# Patient Record
Sex: Female | Born: 1965 | Race: White | Hispanic: No | Marital: Single | Smoking: Current every day smoker
Health system: Southern US, Community
[De-identification: ages and names within clinical notes are randomized; demographics above are authoritative.]

## PROBLEM LIST (undated history)

## (undated) DIAGNOSIS — F32A Depression, unspecified: Secondary | ICD-10-CM

## (undated) DIAGNOSIS — J302 Other seasonal allergic rhinitis: Secondary | ICD-10-CM

## (undated) DIAGNOSIS — F419 Anxiety disorder, unspecified: Secondary | ICD-10-CM

## (undated) DIAGNOSIS — I10 Essential (primary) hypertension: Secondary | ICD-10-CM

## (undated) DIAGNOSIS — F329 Major depressive disorder, single episode, unspecified: Secondary | ICD-10-CM

## (undated) HISTORY — DX: Anxiety disorder, unspecified: F41.9

## (undated) HISTORY — DX: Essential (primary) hypertension: I10

## (undated) HISTORY — DX: Depression, unspecified: F32.A

## (undated) HISTORY — DX: Other seasonal allergic rhinitis: J30.2

## (undated) HISTORY — PX: TUBAL LIGATION: SHX77

## (undated) HISTORY — DX: Major depressive disorder, single episode, unspecified: F32.9

## (undated) HISTORY — PX: PARTIAL HYSTERECTOMY: SHX80

---

## 1998-01-06 ENCOUNTER — Encounter (HOSPITAL_COMMUNITY): Admission: RE | Admit: 1998-01-06 | Discharge: 1998-01-19 | Payer: Self-pay | Admitting: Obstetrics and Gynecology

## 1998-01-16 ENCOUNTER — Inpatient Hospital Stay (HOSPITAL_COMMUNITY): Admission: AD | Admit: 1998-01-16 | Discharge: 1998-01-19 | Payer: Self-pay | Admitting: Obstetrics and Gynecology

## 1998-03-05 ENCOUNTER — Other Ambulatory Visit: Admission: RE | Admit: 1998-03-05 | Discharge: 1998-03-05 | Payer: Self-pay | Admitting: Obstetrics and Gynecology

## 2000-06-20 ENCOUNTER — Encounter: Payer: Self-pay | Admitting: Obstetrics and Gynecology

## 2000-06-23 ENCOUNTER — Ambulatory Visit (HOSPITAL_COMMUNITY): Admission: RE | Admit: 2000-06-23 | Discharge: 2000-06-23 | Payer: Self-pay | Admitting: Obstetrics and Gynecology

## 2001-06-19 ENCOUNTER — Encounter: Admission: RE | Admit: 2001-06-19 | Discharge: 2001-06-19 | Payer: Self-pay | Admitting: Internal Medicine

## 2001-06-19 ENCOUNTER — Encounter: Payer: Self-pay | Admitting: Internal Medicine

## 2002-07-18 ENCOUNTER — Encounter: Payer: Self-pay | Admitting: Internal Medicine

## 2002-07-18 ENCOUNTER — Ambulatory Visit (HOSPITAL_COMMUNITY): Admission: RE | Admit: 2002-07-18 | Discharge: 2002-07-18 | Payer: Self-pay | Admitting: Internal Medicine

## 2003-09-16 ENCOUNTER — Other Ambulatory Visit: Admission: RE | Admit: 2003-09-16 | Discharge: 2003-09-16 | Payer: Self-pay | Admitting: Internal Medicine

## 2005-11-30 ENCOUNTER — Other Ambulatory Visit: Admission: RE | Admit: 2005-11-30 | Discharge: 2005-11-30 | Payer: Self-pay | Admitting: Obstetrics and Gynecology

## 2006-03-06 ENCOUNTER — Ambulatory Visit (HOSPITAL_BASED_OUTPATIENT_CLINIC_OR_DEPARTMENT_OTHER): Admission: RE | Admit: 2006-03-06 | Discharge: 2006-03-06 | Payer: Self-pay | Admitting: Obstetrics and Gynecology

## 2006-04-19 ENCOUNTER — Ambulatory Visit (HOSPITAL_COMMUNITY): Admission: RE | Admit: 2006-04-19 | Discharge: 2006-04-19 | Payer: Self-pay | Admitting: Internal Medicine

## 2006-12-19 ENCOUNTER — Other Ambulatory Visit: Admission: RE | Admit: 2006-12-19 | Discharge: 2006-12-19 | Payer: Self-pay | Admitting: Obstetrics and Gynecology

## 2007-05-22 ENCOUNTER — Emergency Department (HOSPITAL_COMMUNITY): Admission: EM | Admit: 2007-05-22 | Discharge: 2007-05-23 | Payer: Self-pay | Admitting: Emergency Medicine

## 2007-06-27 ENCOUNTER — Ambulatory Visit (HOSPITAL_COMMUNITY): Admission: RE | Admit: 2007-06-27 | Discharge: 2007-06-27 | Payer: Self-pay | Admitting: Obstetrics and Gynecology

## 2007-08-06 ENCOUNTER — Encounter: Payer: Self-pay | Admitting: Obstetrics and Gynecology

## 2007-08-06 ENCOUNTER — Ambulatory Visit (HOSPITAL_BASED_OUTPATIENT_CLINIC_OR_DEPARTMENT_OTHER): Admission: RE | Admit: 2007-08-06 | Discharge: 2007-08-06 | Payer: Self-pay | Admitting: Obstetrics and Gynecology

## 2007-11-22 ENCOUNTER — Encounter: Admission: RE | Admit: 2007-11-22 | Discharge: 2007-11-22 | Payer: Self-pay | Admitting: Internal Medicine

## 2007-11-29 ENCOUNTER — Encounter: Admission: RE | Admit: 2007-11-29 | Discharge: 2007-11-29 | Payer: Self-pay | Admitting: Internal Medicine

## 2007-12-28 ENCOUNTER — Other Ambulatory Visit: Admission: RE | Admit: 2007-12-28 | Discharge: 2007-12-28 | Payer: Self-pay | Admitting: Obstetrics and Gynecology

## 2008-03-27 ENCOUNTER — Emergency Department (HOSPITAL_BASED_OUTPATIENT_CLINIC_OR_DEPARTMENT_OTHER): Admission: EM | Admit: 2008-03-27 | Discharge: 2008-03-27 | Payer: Self-pay | Admitting: Emergency Medicine

## 2008-03-27 ENCOUNTER — Ambulatory Visit: Payer: Self-pay | Admitting: Diagnostic Radiology

## 2008-03-29 ENCOUNTER — Inpatient Hospital Stay (HOSPITAL_COMMUNITY): Admission: AD | Admit: 2008-03-29 | Discharge: 2008-03-29 | Payer: Self-pay | Admitting: Obstetrics & Gynecology

## 2008-05-08 ENCOUNTER — Encounter (INDEPENDENT_AMBULATORY_CARE_PROVIDER_SITE_OTHER): Payer: Self-pay | Admitting: Obstetrics and Gynecology

## 2008-05-09 ENCOUNTER — Inpatient Hospital Stay (HOSPITAL_COMMUNITY): Admission: RE | Admit: 2008-05-09 | Discharge: 2008-05-10 | Payer: Self-pay | Admitting: Obstetrics and Gynecology

## 2008-06-27 IMAGING — MG MM DIGITAL SCREENING BILATERAL
4 series · 4 of 4 positions shown · non-contrast
Comparison: none

DG SCREEN MAMMOGRAM BILATERAL
Bilateral CC and MLO view(s) were taken.

DIGITAL SCREENING MAMMOGRAM WITH CAD:
There are scattered fibroglandular densities.  No masses or malignant type calcifications are 
identified.  Compared with prior studies.

[R CC]
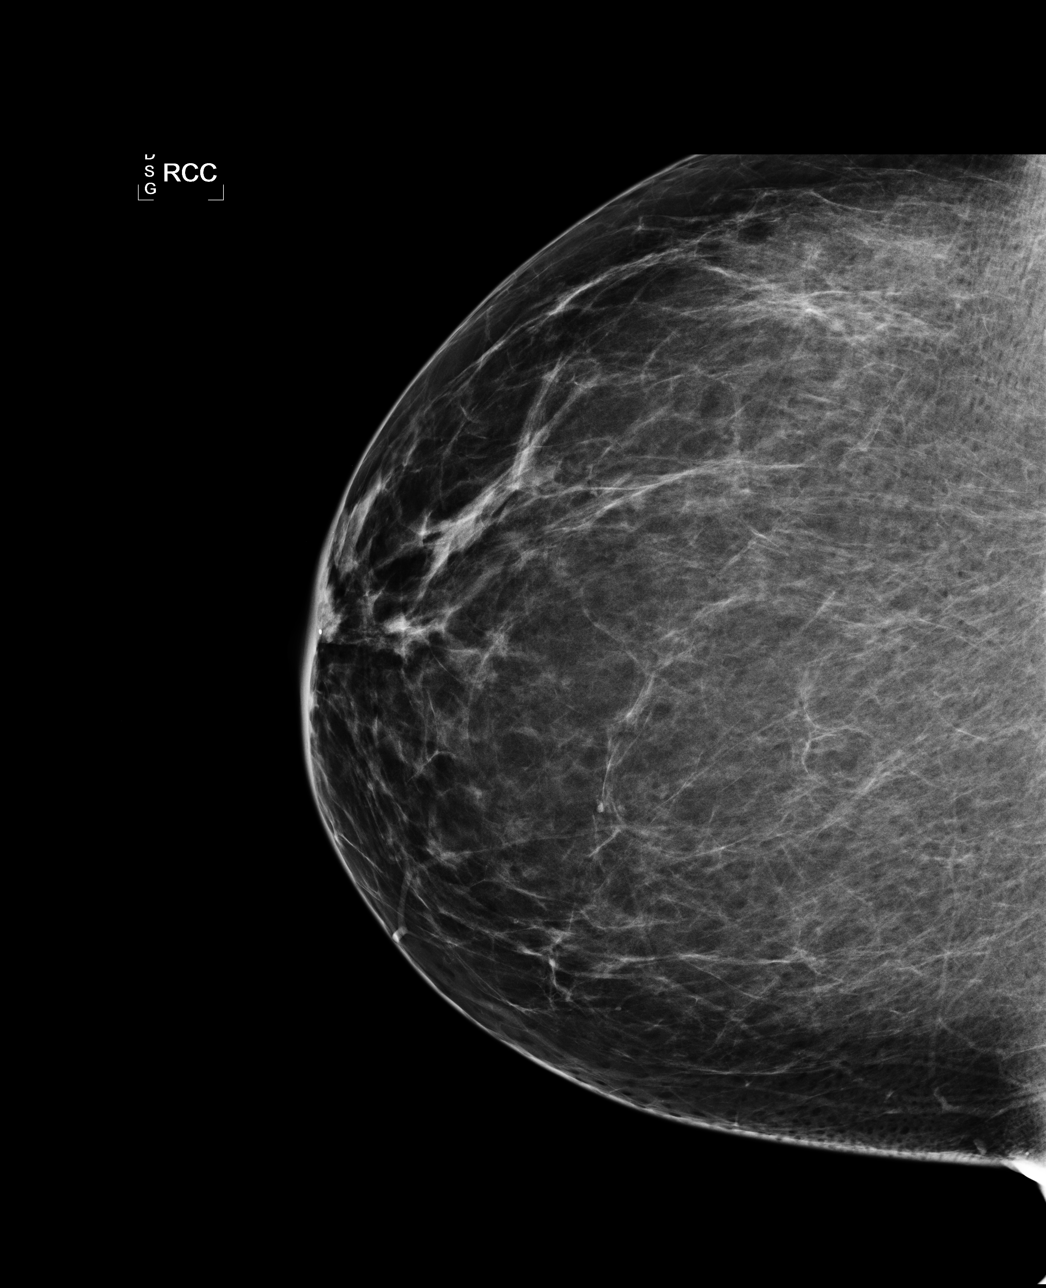

[R MLO]
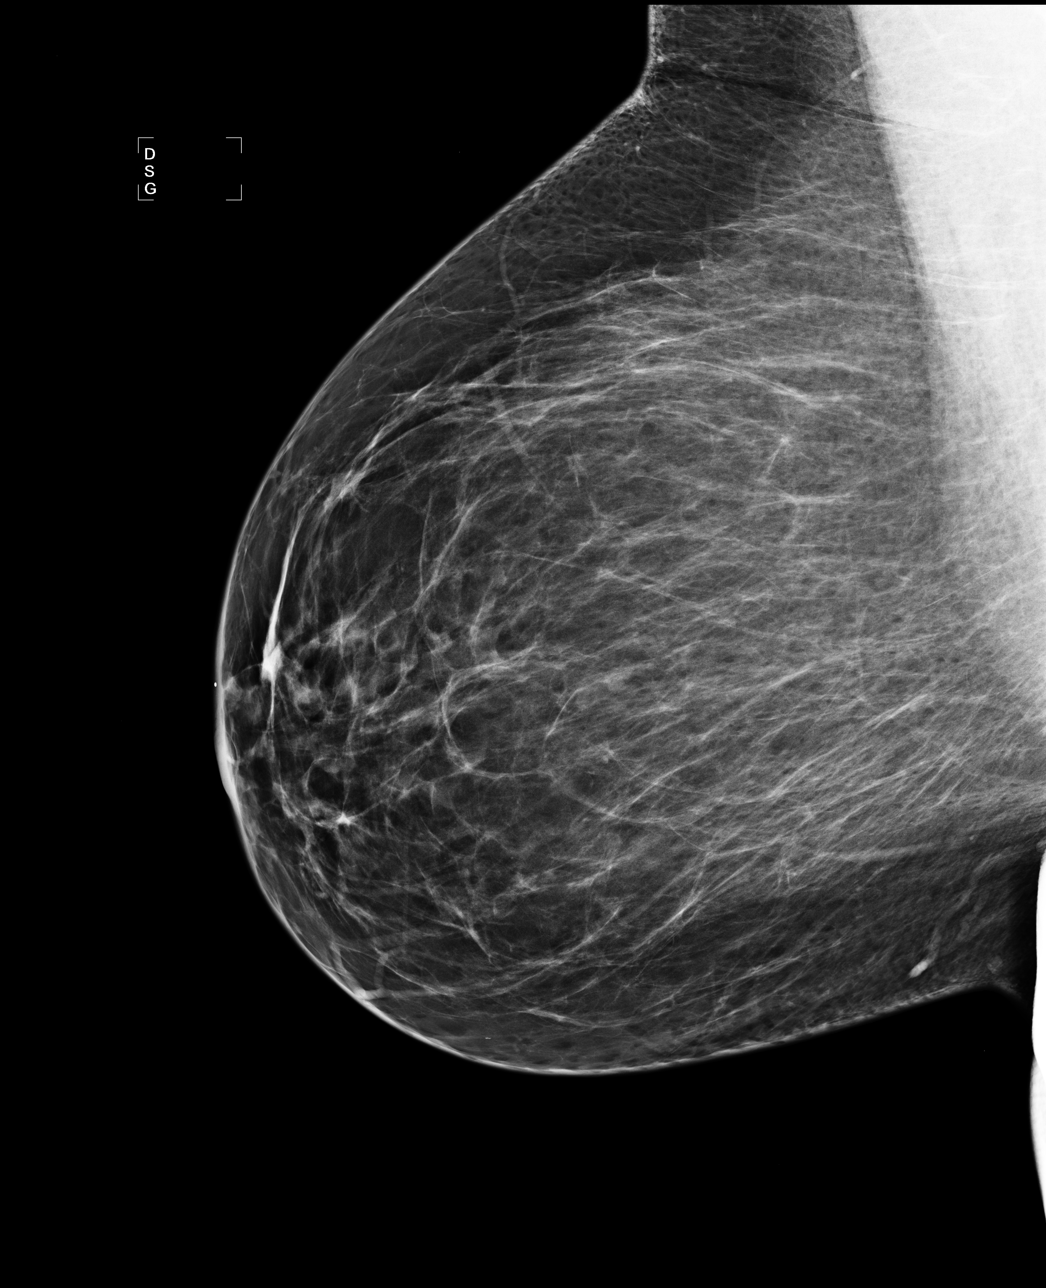

[L CC]
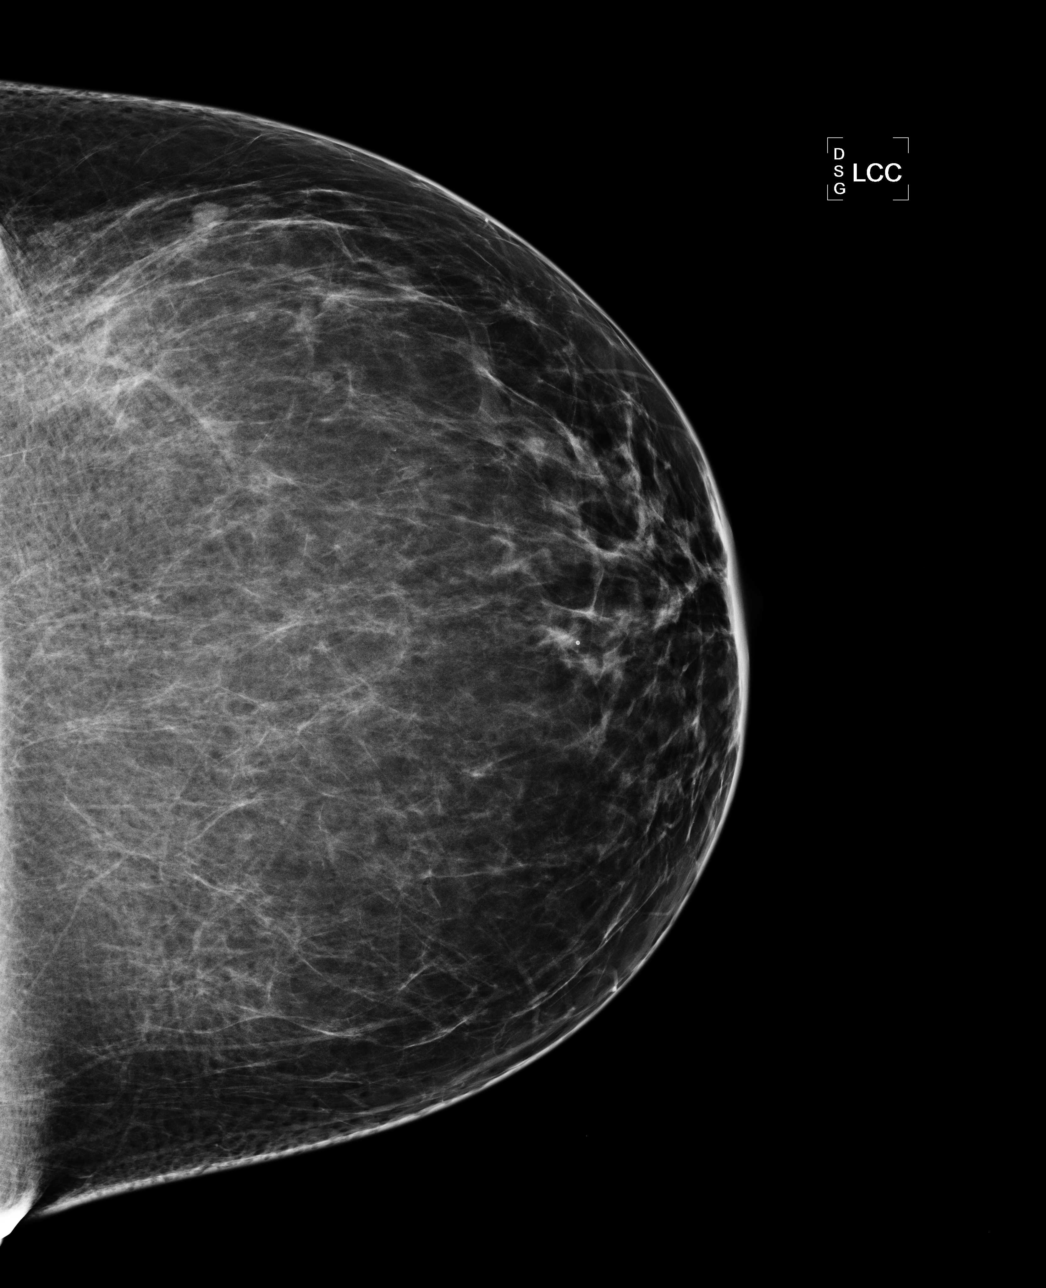

[L MLO]
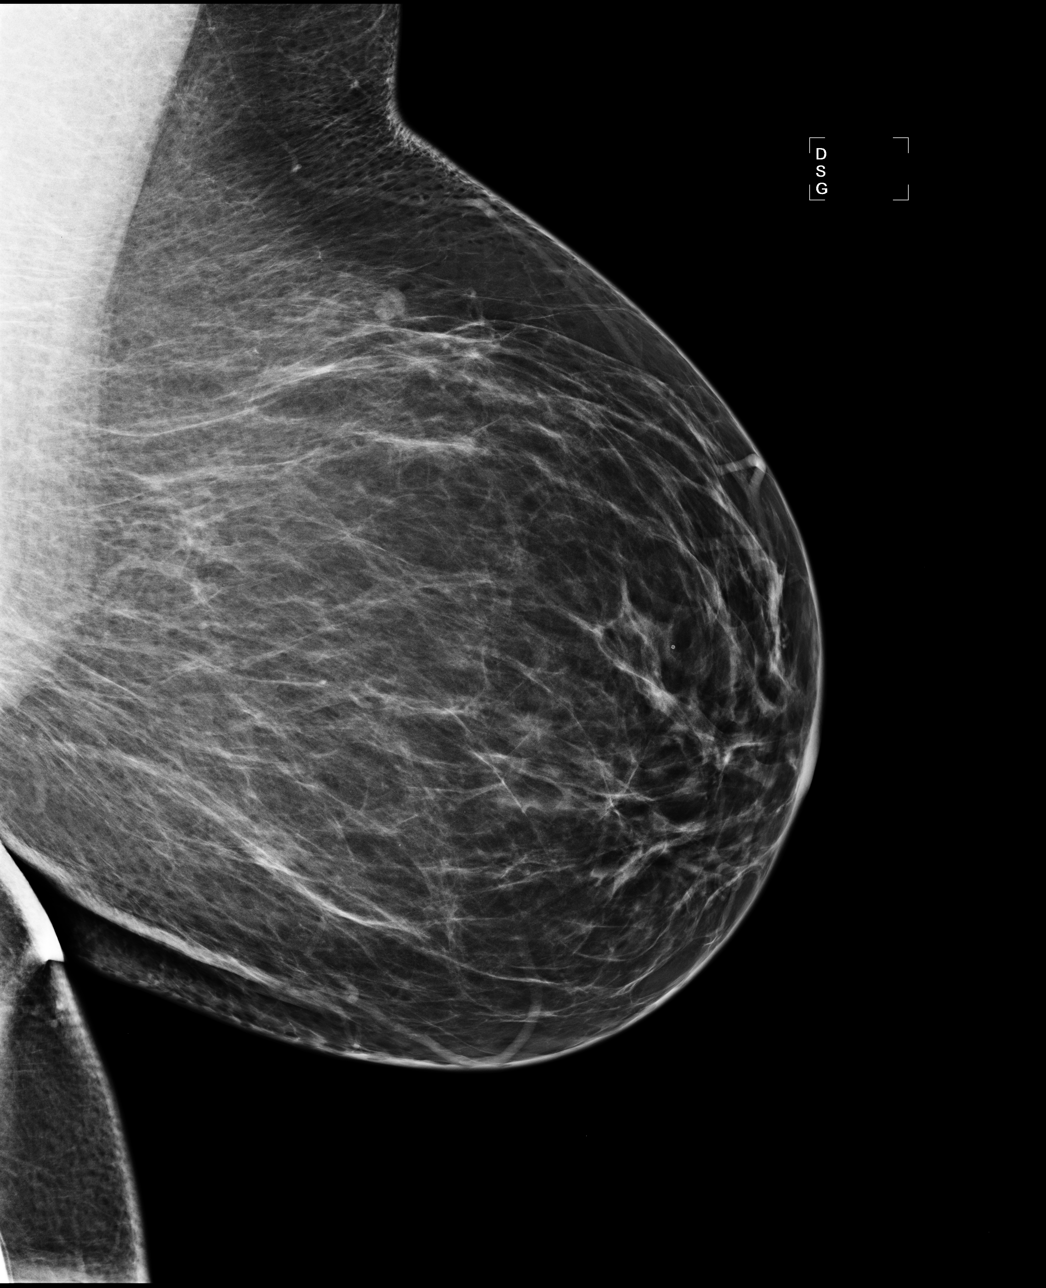

[4 of 4 positions shown; findings below may reference images not displayed]

IMPRESSION: No specific mammographic evidence of malignancy.  Next screening mammogram is recommended in one 
year.

ASSESSMENT: Negative - BI-RADS 1

Screening mammogram in 1 year.
ANALYZED BY COMPUTER AIDED DETECTION. , THIS PROCEDURE WAS A DIGITAL MAMMOGRAM.

## 2009-03-30 IMAGING — US US PELVIS COMPLETE
1 series · 13 of 25 positions shown · non-contrast
Comparison: CT 03/27/2008.

03/31/08 - DUPLICATE COPY for exam association in [facility] – No change from original report.
CLINICAL DATA: Endometrial ablation April 2005. Pelvic pain.

 TRANSABDOMINAL AND TRANSVAGINAL ULTRASOUND OF PELVIS
TECHNIQUE: Both transabdominal and transvaginal ultrasound
 examinations of the pelvis were performed including evaluation of
 the uterus, ovaries, adnexal regions, and pelvic cul-de-sac.

[Series 1: us pelvis complete · 13 of 51 slices shown]
[im 1/51]
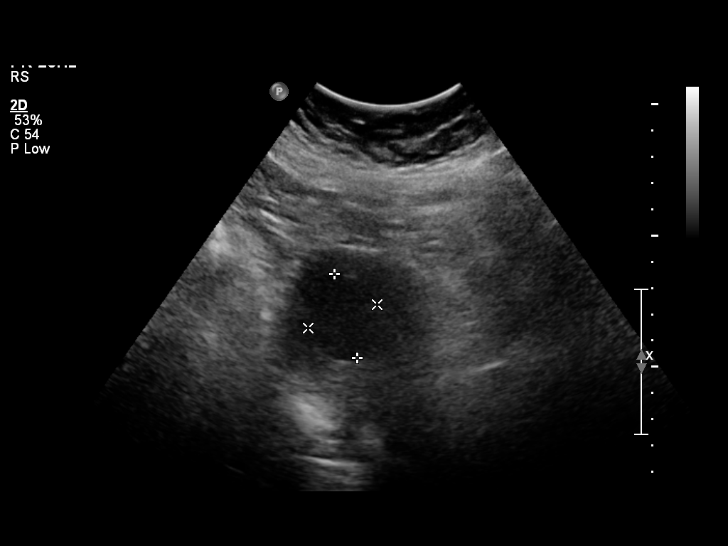
[im 5/51]
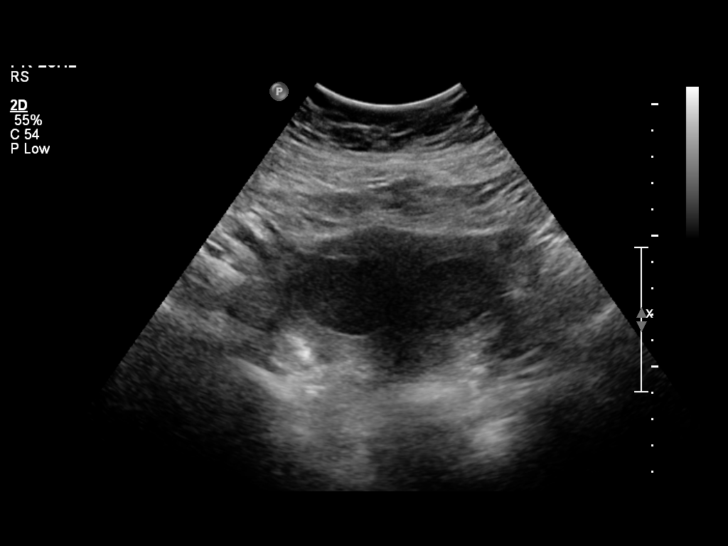
[im 9/51]
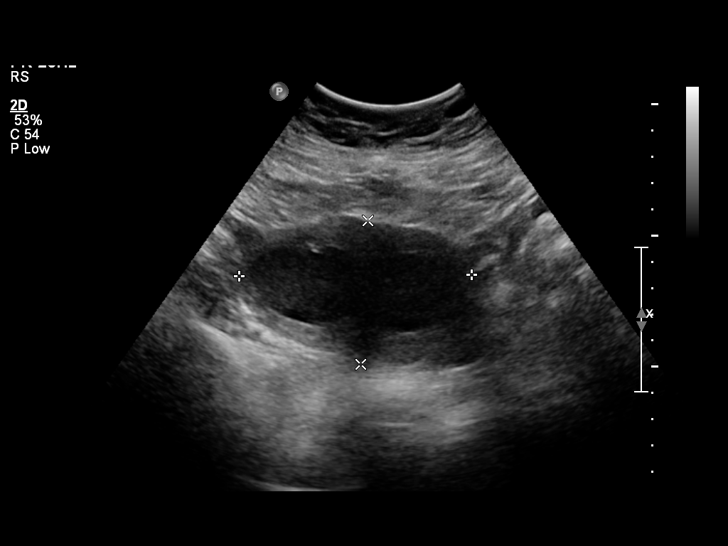
[im 13/51]
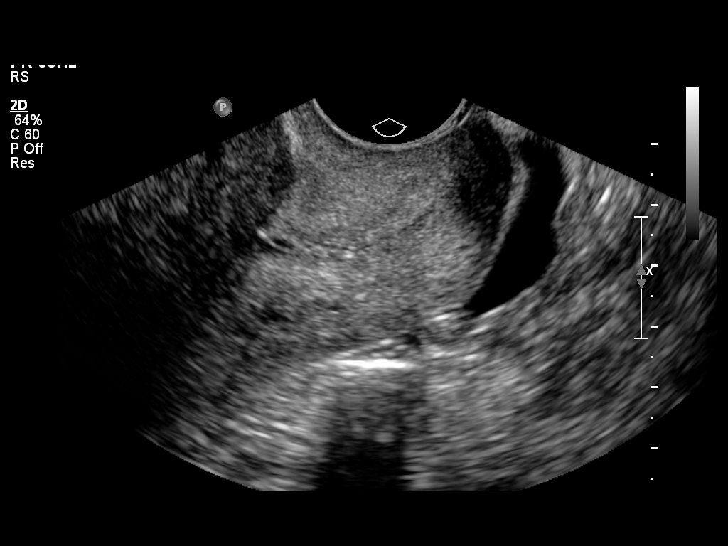
[im 17/51]
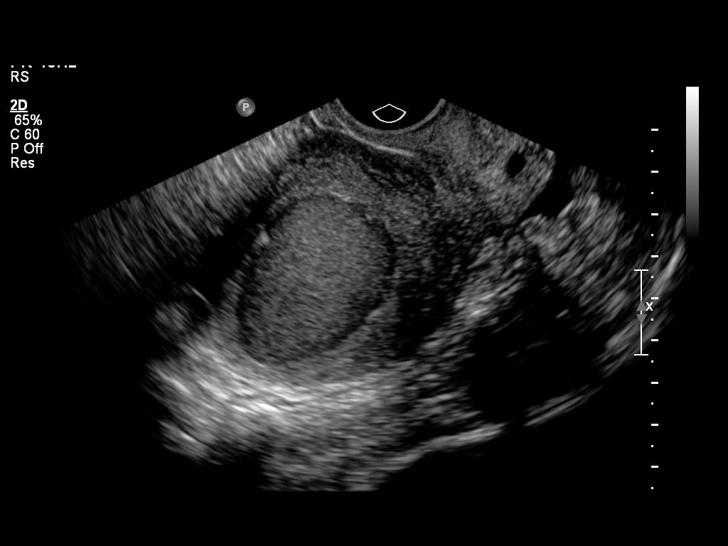
[im 21/51]
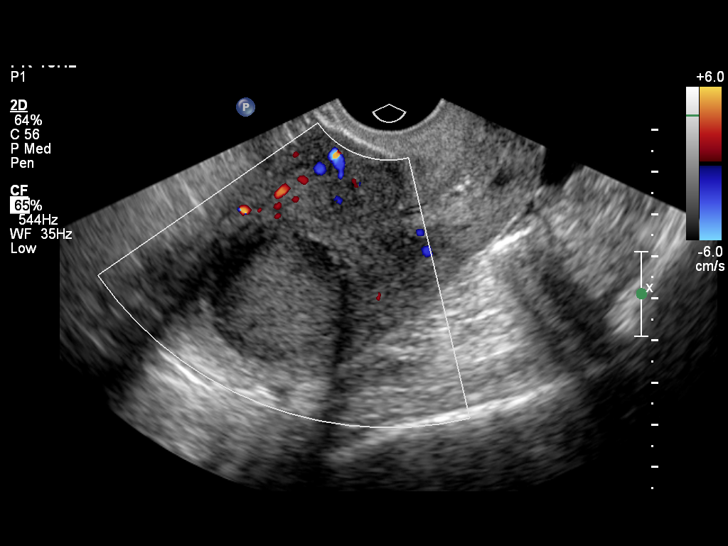
[im 26/51]
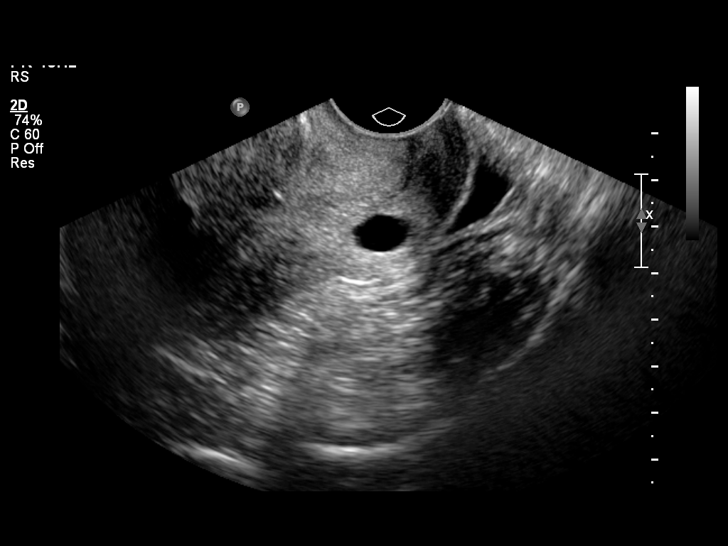
[im 30/51]
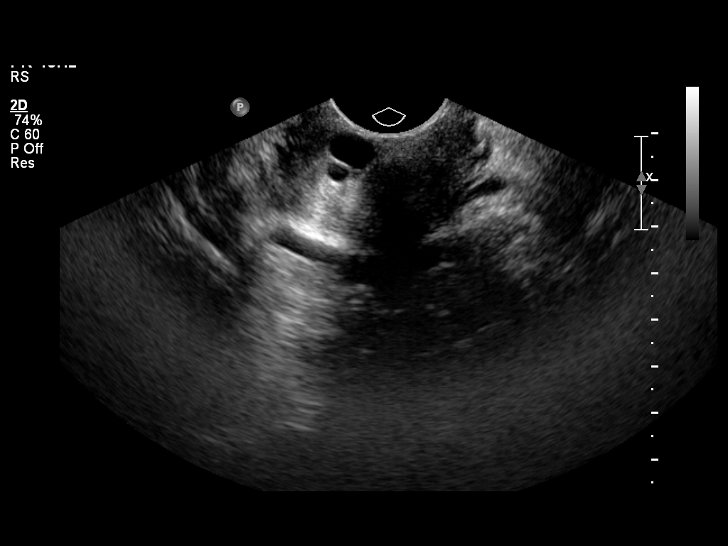
[im 34/51]
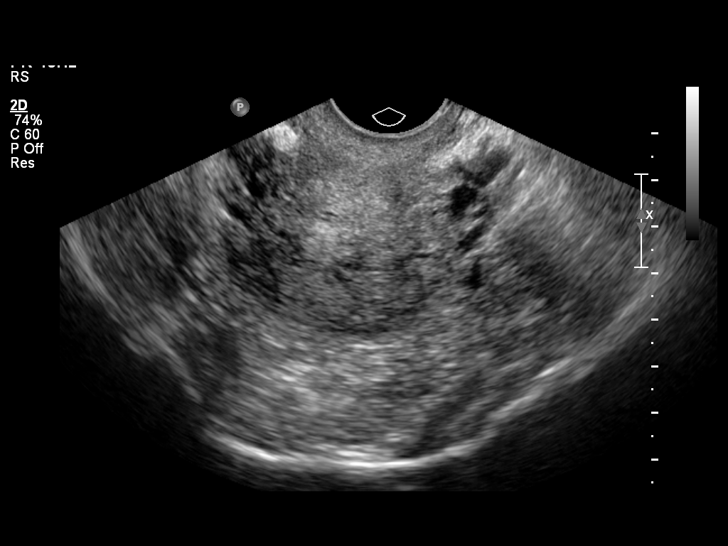
[im 38/51]
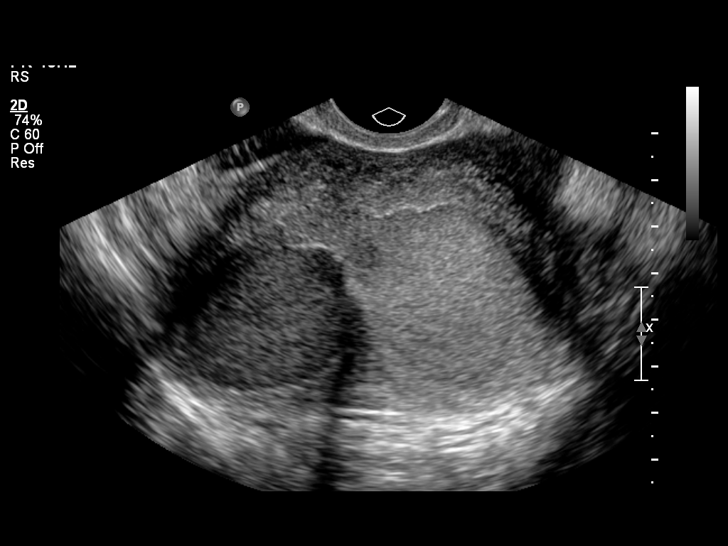
[im 42/51]
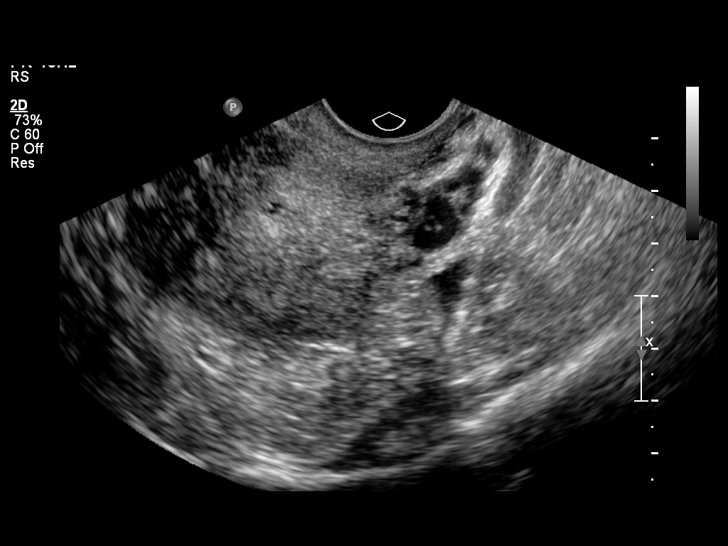
[im 46/51]
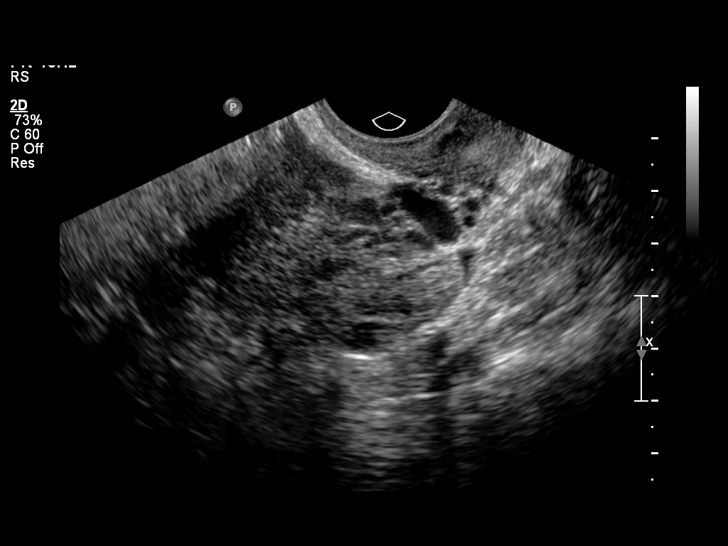
[im 51/51]
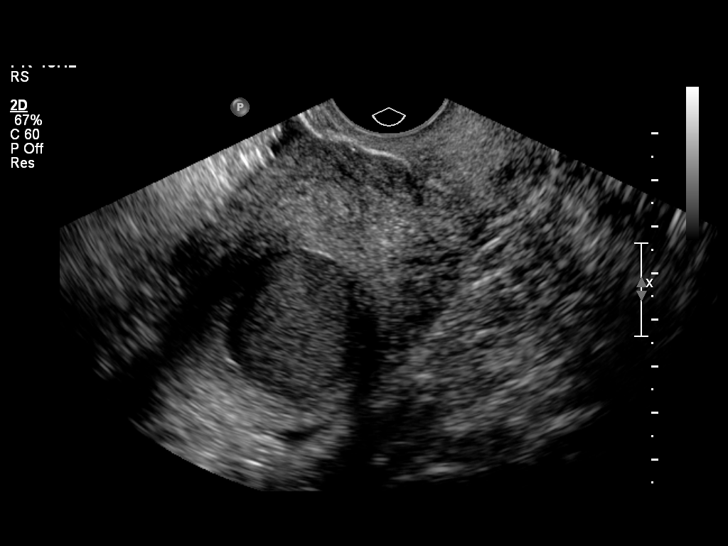

[13 of 25 positions shown; findings below may reference images not displayed]

FINDINGS: The patient has a septate uterus. There is echogenic
 fluid filling the uterine cavity. This extends on either side of
 the septum. Homogeneous echogenic fluid with debris or old blood
 is present in the uterus. This measures approximate 3 cm in
 diameter on the right and 3.4 cm in diameter on the left. This is
 similar to the prior CT scan. The uterus measures 10.2 x 9.8 cm.

 The right ovary is not visualized. The left ovary is normal.
 There is a small amount of free fluid in the pelvis.
IMPRESSION: Septate uterus which is filled with echogenic material. This may
 be due to chronic bleeding from incomplete endometrial ablation.
 Infection not excluded. Overall no change from yesterday's CT
 scan. Small amount of free fluid in the pelvis.

## 2010-04-20 ENCOUNTER — Ambulatory Visit (HOSPITAL_COMMUNITY)
Admission: RE | Admit: 2010-04-20 | Discharge: 2010-04-20 | Payer: Self-pay | Source: Home / Self Care | Attending: Internal Medicine | Admitting: Internal Medicine

## 2010-07-20 LAB — CBC
MCHC: 33.7 g/dL (ref 30.0–36.0)
MCHC: 34.5 g/dL (ref 30.0–36.0)
MCV: 94.6 fL (ref 78.0–100.0)
MCV: 94.7 fL (ref 78.0–100.0)
Platelets: 208 10*3/uL (ref 150–400)
Platelets: 221 10*3/uL (ref 150–400)
RBC: 4.87 MIL/uL (ref 3.87–5.11)
RDW: 13 % (ref 11.5–15.5)
WBC: 13.9 10*3/uL — ABNORMAL HIGH (ref 4.0–10.5)

## 2010-07-20 LAB — COMPREHENSIVE METABOLIC PANEL
ALT: 46 U/L — ABNORMAL HIGH (ref 0–35)
AST: 33 U/L (ref 0–37)
Albumin: 3.8 g/dL (ref 3.5–5.2)
Alkaline Phosphatase: 42 U/L (ref 39–117)
BUN: 9 mg/dL (ref 6–23)
GFR calc Af Amer: >60 mL/min (ref >60)
Potassium: 3.8 mEq/L (ref 3.5–5.1)
Sodium: 137 mEq/L (ref 135–145)
Total Protein: 6.4 g/dL (ref 6.0–8.3)

## 2010-07-20 LAB — DIFFERENTIAL
Basophils Relative: 0 % (ref 0–1)
Eosinophils Relative: 1 % (ref 0–5)
Monocytes Absolute: 0.7 10*3/uL (ref 0.1–1.0)
Monocytes Relative: 8 % (ref 3–12)
Neutro Abs: 4.9 10*3/uL (ref 1.7–7.7)

## 2010-08-17 NOTE — H&P (Signed)
NAMERENELDA, Stefanie Payne              ACCOUNT NO.:  [id number]   MEDICAL RECORD NO.:  [id number]          PATIENT TYPE:  AMB   LOCATION:  [facility]                           FACILITY:  [facility]   PHYSICIAN:  Malachi Pro. Ambrose Mantle, M.D. DATE OF BIRTH:  [birth year]   DATE OF ADMISSION:  05/08/2008  DATE OF DISCHARGE:                              HISTORY & PHYSICAL   This is a 45 year old white married female, para 0-1-0-2, who is  admitted to the hospital for abdominal hysterectomy because of severe  menorrhagia, dysmenorrhea and hematometra after endometrial oblation.  Last menstrual period was April 19, 2008, it lasted 10 days with  medium flow.  Previous period was several months before in May 2009,  when she had severely heavy bleeding and pain.  She followed up with a  gynecologist and exam was normal.  This patient had twins by cesarean  section at 37-1/2 weeks in 1999.  No mention was made of any uterine  abnormality.  In 2006, she began having very heavy bleeding.  She had an  endometrial ablation in January 2007, and she completely stopped  bleeding until May 2009, when she went to the emergency room in May 2009  with very heavy bleeding and pain and she followed up with her  gynecologist and the exam was absolutely normal.  She did not did  fairly well, except having monthly dysmenorrhea without periods until  December 2009, when she had the onset of severe heavy bleeding and  extremely severe pain and any movement would exacerbate the pain.  Ultrasound of the abdomen and pelvis and CT scan showed what was thought  to be a septate uterus which was filled with echogenic material.  Homogeneous echogenic fluid with debris or old blood was present in the  uterus, it measured 3 cm on the right and 3.4 cm on the left.  The  patient then saw Dr. Despina Hidden at [facility], and he was able to drain  the endometrial cavity of significant blood.  The patient then did not  okay until April 19, 2008, when  she bled again for 10 days with medium  flow and pain at 8/10.  She came to me on April 17, 2008, requesting  to proceed with hysterectomy to avoid the continued problems that she  had experienced after the endometrial ablation.  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  She did undergo an endometrial aspiration on April 23, 2008, and this showed proliferative endometrium admixed with  abundant blood.  No hyperplasia or carcinoma was identified.   PAST MEDICAL HISTORY:   ALLERGIES:  1. NOVOCAIN.  2. FLAGYL.  3. HYDROCODONE.   Illnesses:  She has high blood pressure.   PAST SURGICAL HISTORY:  1. Cesarean section and tubal ligation.  2. Tonsillectomy.  3. NovaSure ablation of the endometrial cavity.   She does not drink.  She  smokes 5 cigarettes a day.  She works at a  [employer].   REVIEW OF SYSTEMS:  No heart or bowel problems.  No visual problems or  headaches.   FAMILY HISTORY:  Mother is 71 with high cholesterol.  She did have colon  cancer and lung cancer.  Father is 46 with high blood pressure, gout and  heart problems.  A 37 year old brother has high blood pressure and acid  reflux.   MEDICATIONS:  Metoprolol, hydrochlorothiazide and Xanax.   PHYSICAL EXAMINATION:  GENERAL:  Reveals a well-developed obese white  female in no distress.  VITAL SIGNS:  Blood pressure 124/82, pulse is 80, weight is 255 pounds.  HEAD, EYES, EARS, NOSE AND THROAT:  Reveal no cranial abnormalities.  Extraocular movements are intact.  Nose and pharynx are clear.  NECK:  Supple without thyromegaly.  HEART:  Normal size and sounds.  No murmurs.  LUNGS:  Clear to auscultation.  BREASTS:  Soft without masses.  ABDOMEN:  Soft and flat, nontender.  There is a depressed lower   transverse scar.  The scar is quite low; it actually rests right over  the pubic bone, and I have informed the patient that I not sure that I  will utilize that scar.  I may use an incision slightly above the scar.  The vulva and vagina are clean.  Pap smear in September 2009, was okay.  The cervix is clean.  The uterus is anterior, normal size.  The adnexa  are clear.   ADMITTING IMPRESSION:  Menorrhagia, dysmenorrhea and hematometra after  endometrial ablation.   The patient is admitted for abdominal hysterectomy, possible bilateral  salpingo-oophorectomy.  I told the patient because of her prior C-  section, I did not want to a vaginal hysterectomy, I would do a  laparoscopic assisted hysterectomy if she chose.  She preferred to  proceed with abdominal hysterectomy.  The patient has been informed of  the risks of surgery, including but not limited to, heart attack,  stroke, pulmonary embolus, wound disruption, hemorrhage with need for  reoperation and/or transfusion, fistula formation, nerve injury and  intestinal obstruction.  She understands and agrees to proceed.  She has  also been informed that no guarantee can be made as to the impact the  surgery might have on her sex drive and performance.      Malachi Pro. Ambrose Mantle, M.D.  Electronically Signed     TFH/MEDQ  D:  05/07/2008  T:  05/07/2008  Job:  [id number]

## 2010-08-17 NOTE — Discharge Summary (Signed)
NAMEERCEL, NORMOYLE              ACCOUNT NO.:  [id number]   MEDICAL RECORD NO.:  [id number]          PATIENT TYPE:  INP   LOCATION:  9305                          FACILITY:  [facility]   PHYSICIAN:  Malachi Pro. Ambrose Mantle, M.D. DATE OF BIRTH:  [birth year]   DATE OF ADMISSION:  05/08/2008  DATE OF DISCHARGE:  05/10/2008                               DISCHARGE SUMMARY   This is a 45 year old white female with menorrhagia, dysmenorrhea,  history of hematometra, all following an endometrial ablation, admitted  for abdominal hysterectomy.  The patient underwent abdominal  hysterectomy with removal of the proximal segment of both fallopian  tubes by Dr. Ambrose Mantle with Dr. Jackelyn Knife assisting, under general  anesthesia.  Surgery was uncomplicated and was associated with a blood  loss of about 100 mL.  The ureters looked normal.  Postoperatively, the  patient did well.  She ambulated well without difficulty, tolerated a  regular diet, voided well, and passed flatus.  Her abdomen was soft and  nontender.  Incision was healing well and on the second postop day, she  was ready for discharge.  Urine pregnancy test was negative.  Initial  hemoglobin 15.3, hematocrit 45.1, white count 8300, platelet count  221,000 with 59 segs, 32 lymphs, 8 monos, and 1 eosinophil.  Comprehensive metabolic profile was normal except for the ALT of 46.  The AST was 33.  Followup hemoglobins were 15.5 and 14.9.  Estimated  glomerular filtration rate was greater than 60 mL/min.  Pathology report  is pending at the present time.   FINAL DIAGNOSES:  1. Menorrhagia.  2. Dysmenorrhea.  3. History of hematometra.  4. Endometrial ablation.   OPERATION:  Abdominal hysterectomy and removal of proximal segment of  both fallopian tubes.   FINAL CONDITION:  Improved.   INSTRUCTIONS:  Our regular discharge instructions.  No vaginal entrance,  no heavy lifting, or strenuous activity.  Call with any fever greater  than 100.4 degrees.   Return to the office in 2-5 days for staple  removal, report any unusual problems.  The patient is to continue her  medications as at home and continue the Darvocet-N 100, 36 tablets 1  every 4-6 hours as needed for pain and Motrin 600 mg 30 tablets 1 every  6 hours as needed for pain.      Malachi Pro. Ambrose Mantle, M.D.  Electronically Signed     TFH/MEDQ  D:  05/10/2008  T:  05/10/2008  Job:  [id number]

## 2010-08-17 NOTE — Op Note (Signed)
NAMEDENVER, HARDER              ACCOUNT NO.:  [id number]   MEDICAL RECORD NO.:  [id number]          PATIENT TYPE:  OIB   LOCATION:  9305                          FACILITY:  [facility]   PHYSICIAN:  Malachi Pro. Ambrose Mantle, M.D. DATE OF BIRTH:  13-Jan-1966   DATE OF PROCEDURE:  05/08/2008  DATE OF DISCHARGE:                               OPERATIVE REPORT   PREOPERATIVE DIAGNOSES:  1. Menorrhagia.  2. Dysmenorrhea.  3. History of hematometra.   POSTOPERATIVE DIAGNOSES:  1. Menorrhagia.  2. Dysmenorrhea.  3. History of hematometra.   OPERATION:  Abdominal hysterectomy, removal of proximal segment of both  fallopian tubes.   OPERATOR:  Malachi Pro. Ambrose Mantle, MD   ASSISTANT:  Zenaida Niece, MD   ANESTHESIA:  General anesthesia.   The patient was brought to the operating room and placed under  satisfactory general anesthesia.  She was placed in frog-leg position.  The abdomen, vulva, vagina, urethra were prepped with Betadine solution.  Catheter was placed to straight drain.  The patient was then placed  supine.  The abdomen was draped as a sterile field.  The patient's  previous transverse incision for the C-section had been made fairly low  and that was right over the pubic bone, so I elected a spot about an  inch above the old incision and made the incision through the skin and  subcutaneous tissue and carried down to the fascia.  I divided the  fascia transversely, separated it from the rectus muscles superiorly and  inferiorly.  The rectus muscle was slightly split in the midline.  I  entered the peritoneal cavity with my finger.  I then enlarged the  incision by dividing the rectus muscles in the midline and then dividing  the peritoneum down to the bladder.  I examined the upper abdomen,  although it was difficult to examine with a fairly low incision and her  quite marked obesity with lots of omentum.  I was able to feel the  kidneys, but not really examine them well.  I felt the  liver, I could  not feel the gallbladder.  There were no significant abdominal  adhesions.  I then explored the pelvis.  She had had previous tubal  ligation.  The proximal segment of both tubes had very minimal  hydrosalpinx, so I definitely wanted to remove the proximal segment of  the tubes with the uterus.  Both ovaries appeared normal.  The cul-de-  sacs were normal.  There was minimal scarring anteriorly from this  previous C-section.  I used the Teacher, early years/pre in 4 packs to prepare  the operative field.  Then pulled the uterus up with clamps across both  upper pedicles at the uterine cornu.  I divided both round ligaments  with the electrical current and created a bladder flap.  I then divided  the utero-ovarian ligament and the tube with a clamp that caused the  proximal portion of the tube to go with the uterus.  I doubly suture  ligated both upper pedicles.  I then skeletonized both uterine vessels,  clamped, cut, and suture ligated them.  I took one bite below the  uterine vessels bilaterally, cut and suture ligated them, and then  divided the uterosacral ligaments and held them.  Additional bites were  taken below the uterosacral ligaments until I got below the cervix,  clamped across the upper portion of the vagina, and removed the uterus  by transecting the remaining part of the upper vagina.  I placed vaginal  angle sutures and then the central portion of the cuff was closed with  interrupted figure-of-eight sutures of 0-Vicryl.  I sutured the  uterosacral ligaments together in the midline, searched for hemostasis,  liberally irrigated to confirm that there was no significant bleeding,  reperitonealized across the vaginal cuff.  I did not make a diligent  search for the ureters because we had stayed right next to the uterus  throughout the entire operation.  Liberal irrigation again confirmed  hemostasis.  The packs and retractor were removed, and the abdominal  wall was  closed in layers using interrupted sutures of 0-Vicryl  including the rectus muscle and peritoneum in one bite.  The fascia was  closed with 2 running sutures of 0-Vicryl subcu with a running 3-0  Vicryl and staples were used on the skin.  I did liberally irrigate the  subcutaneous and subfascial tissues.  The patient seemed to tolerate the  procedure well.  Blood loss was estimated by the nurse anesthetist at  100 mL.  Sponge and needle counts were correct, and she was returned to  recovery in satisfactory condition.      Malachi Pro. Ambrose Mantle, M.D.  Electronically Signed     TFH/MEDQ  D:  05/08/2008  T:  05/09/2008  Job:  [id number]

## 2010-08-17 NOTE — Op Note (Signed)
NAME:  Stefanie Payne, Stefanie Payne              ACCOUNT NO.:  [id number]   MEDICAL RECORD NO.:  [id number]          PATIENT TYPE:  AMB   LOCATION:  [facility]                         [facility]AC[facility]   PHYSICIAN:  Cynthia P. Romine, M.D.DATE OF BIRTH:  [birth year]   DATE OF PROCEDURE:  08/06/2007  DATE OF DISCHARGE:                               OPERATIVE REPORT   PREOPERATIVE DIAGNOSES:  Endometrial polyp, intrauterine synechiae,  dysfunctional uterine bleeding.   POSTOPERATIVE DIAGNOSIS:  Endometrial polyp, intrauterine synechiae,  dysfunctional uterine bleeding, pathology pending.   PROCEDURE:  Hysteroscopy with lysis of adhesions and D&C.   SURGEON:  Meredeth Ide, M.D.   ANESTHESIA:  General by LMA.   ESTIMATED BLOOD LOSS:  Minimal.  Sorbitol deficit 100 mL.   COMPLICATIONS:  None.   DESCRIPTION OF PROCEDURE:  The patient was taken to the operating room  and after induction of adequate general anesthesia by LMA,  she was  placed in the dorsal lithotomy position, prepped and draped in the usual  fashion.  The bladder was drained with a latex free catheter.  A  posterior weighted and anterior Sims retractor were placed.  The cervix  was grasped on its anterior lip with a single-tooth tenaculum.  The  sound went to 6 cm.  The uterus is known to be 8 cm in depth, therefore  it was felt that perhaps synechiae were precluding the sound and going  to the top of the fundus.  The cervix was dilated with Shawnie Pons dilators to  a #31 Pratt and the hysteroscope was introduced.  It was difficult to  pass the hysteroscope into the endometrial cavity and indeed the tubal  ostia were never seen.  It was felt why the hysteroscope was being  stopped and an area of synechiae.  The scope was withdrawn.  A second  tenaculum was put on the posterior lip in an attempt to straighten the  axis of the uterus and the cervix was dilated again and this time it did  feel like the dilators went to the fundus to 8 cm.  It  was dilated to 31  and the hysteroscope was then reintroduced.  Sorbitol was being used as  the distention medium.  The pressure had been set at 70 mmHg.  It was  now increased to 90 mmHg.  It was felt that the scope went in further  than it had the first time, but still the tubal ostia could not be seen.  There was very poor visualization of the cavity.  Photographic  documentation was taken.  Polyp forceps were used to explore the cavity  and a small piece of apparent polyp or endometrium was removed and sent  to pathology with the curetting.  After the scope was withdrawn, gentle  sharp  curettage was carried out and the specimen was all sent to pathology.  The instruments were removed from the vagina and the procedure was  terminated.  The patient tolerated it well and went in satisfactory  condition to post anesthesia recovery.      Cynthia P. Romine, M.D.  Electronically Signed  CPR/MEDQ  D:  08/06/2007  T:  08/06/2007  Job:  [id number]   cc:   Edwena Felty. Romine, M.D.  Fax: [phone]

## 2010-08-20 NOTE — Op Note (Signed)
[facility]  Patient:    Stefanie Payne, Stefanie Payne                     MRN: [id number] Proc. Date: 06/23/00 Adm. Date:  [id number] Attending:  Brynda Peon                           Operative Report  PREOPERATIVE DIAGNOSIS:  Desires attempt at permanent surgical sterilization.  POSTOPERATIVE DIAGNOSIS:  Desires attempt at permanent surgical sterilization.  PROCEDURE:  Falope-ring laparoscopic bilateral tubal sterilization procedure.  SURGEON:  Cynthia P. Ashley Royalty, M.D.  ANESTHESIA:  General endotracheal.  ESTIMATED BLOOD LOSS:  Minimal.  COMPLICATIONS:  None.  DESCRIPTION OF PROCEDURE:  The patient was taken to the operating room and after the induction of adequate general endotracheal anesthesia, was prepped and draped in the usual fashion and placed in the dorsal lithotomy position. The cervix was grasped with a single-tooth tenaculum, and then a Hulka uterine manipulator was placed.  The bladder was drained with a red rubber catheter. Attention was next turned to the abdomen.  An infraumbilical incision was made, and the Veress needle was inserted into the peritoneal space.  Proper placement was tested by noting a negative aspirate and free flow of saline through the Veress needle again with a negative aspirate and then by noting the response of a drop of saline placed with the hub of the Veress needle, negative pressure as the abdominal wall was elevated.  Pneumoperitoneum was then created with two liters of CO2 using the automatic insufflator.  A disposable 11 mm trocar was then inserted into the peritoneal space and its proper placement noted with the laparoscope.  A small suprapubic incision was made, and the 8 mm trocar was inserted under direct visualization.  The pelvis was inspected.  There was an approximately 1.5 cm posterior fundal fibroid. Otherwise the uterus appeared normal.  The tubes and ovaries also appeared normal.  There were  clear, apparently functional cysts on both ovaries.  Each were approximately 1.5 cm in dimension.  The right fallopian tube was identified and traced to its fimbriated end.  The ______  portion elevated, and a Falope-ring was placed.  A good knuckle of tube was noted to be contained within the ring, and good blanching was noted.  The procedure was repeated on the patients left, identifying the tube and tracing it to its fimbriated end. The ______  portion was elevated, and a fallopring was placed.  Again, a good knuckle of tube was noted to be contained within the ring, and good blanching was noted.  No bleeding was encountered. Photographic documentation was taken of the placement of the tubes and of the fibroid.  The laparoscope was then removed, and pneumoperitoneum was allowed to escape.  The sleeves were removed.  The incisions were closed subcuticularly with 3-0 Dexon.  Marcaine 0.5% was injected subcutaneous for postoperative pain relief. Instruments were removed from the vagina, and the procedure was terminated.  The patient tolerated it well and went in satisfactory condition to postanesthesia recovery. DD:  06/23/00 TD:  06/24/00 Job: [id number]

## 2010-08-20 NOTE — Op Note (Signed)
Stefanie Payne, Stefanie Payne              ACCOUNT NO.:  [id number]   MEDICAL RECORD NO.:  [id number]          PATIENT TYPE:  AMB   LOCATION:  [facility]                         FACILITY:  [facility]   PHYSICIAN:  Cynthia P. Romine, M.D.DATE OF BIRTH:  Jul 25, 1965   DATE OF PROCEDURE:  03/06/2006  DATE OF DISCHARGE:                               OPERATIVE REPORT   PREOPERATIVE DIAGNOSIS:  Menorrhagia.   POSTOPERATIVE DIAGNOSIS:  Menorrhagia.   PROCEDURE:  NovaSure endometrial ablation.   SURGEON:  Cynthia P. Romine, M.D.   ANESTHESIA:  General by LMA.   ESTIMATED BLOOD LOSS:  Minimal.   ESTIMATED SALINE DEFICIT:  Essentially 0.   COMPLICATIONS:  None.   PROCEDURE:  The patient was taken to the operating room and after  induction of adequate general anesthesia by LMA, was placed in dorsal  lithotomy position and prepped and draped in usual fashion.  The bladder  was drained with a latex free catheter as the patient was latex  allergic. A posterior weighted and anterior Sims retractor placed.  The  cervix was grasped on its anterior lip with a single-tooth tenaculum.  The uterus sounded to 9 cm. The cervix was then dilated to a #21 Shawnie Pons.  A #8 Hegar was then used to assess the length of the cervix which was 4  cm with a cavity length of 5 cm. The diagnostic hysteroscope was  introduced and the cavity was felt to be intact and normal size and  shape.  The scope was withdrawn.  The NovaSure ablation instrument was  inserted and the ablation was done according to the manufacturer's  specifications.  There were no complications.  The maximum cavity width  was 2.75 cm. The power was 74 watts. The time of the ablation was 120  seconds.  Following completion of the ablation the endoscope was  withdrawn.  The hysteroscope was reinserted.  The cavity looked  excellent and the ablated photographic documentation was taken. The  scope was withdrawn.  The instruments removed from the vagina and the  patient was taken to the recovery room in satisfactory condition.      Cynthia P. Romine, M.D.  Electronically Signed     CPR/MEDQ  D:  03/06/2006  T:  03/06/2006  Job:  [id number]

## 2010-12-24 LAB — I-STAT 8, (EC8 V) (CONVERTED LAB)
BUN: 8
Chloride: 105
HCT: 44
Operator id: 257131
pCO2, Ven: 38.5 — ABNORMAL LOW
pH, Ven: 7.437 — ABNORMAL HIGH

## 2010-12-24 LAB — DIFFERENTIAL
Basophils Relative: 0
Eosinophils Absolute: 0.1
Neutrophils Relative %: 65

## 2010-12-24 LAB — GC/CHLAMYDIA PROBE AMP, GENITAL: GC Probe Amp, Genital: NEGATIVE

## 2010-12-24 LAB — URINALYSIS, ROUTINE W REFLEX MICROSCOPIC
Leukocytes, UA: NEGATIVE
Protein, ur: NEGATIVE
Urobilinogen, UA: 0.2

## 2010-12-24 LAB — CBC
MCHC: 35
MCV: 91.9
Platelets: 252

## 2010-12-24 LAB — POCT I-STAT CREATININE
Creatinine, Ser: 1
Operator id: 257131

## 2010-12-24 LAB — URINE MICROSCOPIC-ADD ON

## 2010-12-29 LAB — CBC
Hemoglobin: 14.8
RDW: 12.6

## 2010-12-29 LAB — POCT PREGNANCY, URINE: Preg Test, Ur: NEGATIVE

## 2011-01-07 LAB — URINALYSIS, ROUTINE W REFLEX MICROSCOPIC
Bilirubin Urine: NEGATIVE
Glucose, UA: NEGATIVE mg/dL
Ketones, ur: NEGATIVE mg/dL
Leukocytes, UA: NEGATIVE
Nitrite: NEGATIVE
Protein, ur: NEGATIVE mg/dL
Specific Gravity, Urine: <1.005 — ABNORMAL LOW (ref 1.005–1.030)
Specific Gravity, Urine: 1.023 (ref 1.005–1.030)
Urobilinogen, UA: 0.2 mg/dL (ref 0.0–1.0)
Urobilinogen, UA: 0.2 mg/dL (ref 0.0–1.0)
pH: 6 (ref 5.0–8.0)
pH: 6 (ref 5.0–8.0)

## 2011-01-07 LAB — COMPREHENSIVE METABOLIC PANEL WITH GFR
ALT: 63 U/L — ABNORMAL HIGH (ref 0–35)
AST: 43 U/L — ABNORMAL HIGH (ref 0–37)
Alkaline Phosphatase: 67 U/L (ref 39–117)
CO2: 26 meq/L (ref 19–32)
Calcium: 9.4 mg/dL (ref 8.4–10.5)
Chloride: 105 meq/L (ref 96–112)
GFR calc Af Amer: >60 mL/min (ref >60)
GFR calc non Af Amer: >60 mL/min (ref >60)
Glucose, Bld: 102 mg/dL — ABNORMAL HIGH (ref 70–99)
Potassium: 3.6 meq/L (ref 3.5–5.1)
Sodium: 140 meq/L (ref 135–145)
Total Bilirubin: 0.5 mg/dL (ref 0.3–1.2)

## 2011-01-07 LAB — DIFFERENTIAL
Basophils Absolute: 0 10*3/uL (ref 0.0–0.1)
Basophils Relative: 1 % (ref 0–1)
Eosinophils Absolute: 0 K/uL (ref 0.0–0.7)
Eosinophils Relative: 0 % (ref 0–5)
Lymphocytes Relative: 15 % (ref 12–46)
Lymphs Abs: 1.4 K/uL (ref 0.7–4.0)
Monocytes Absolute: 0.3 10*3/uL (ref 0.1–1.0)
Monocytes Relative: 3 % (ref 3–12)
Neutro Abs: 7.4 10*3/uL (ref 1.7–7.7)
Neutrophils Relative %: 81 % — ABNORMAL HIGH (ref 43–77)

## 2011-01-07 LAB — CBC
HCT: 45.6 % (ref 36.0–46.0)
Hemoglobin: 15.4 g/dL — ABNORMAL HIGH (ref 12.0–15.0)
MCHC: 33.8 g/dL (ref 30.0–36.0)
MCV: 93.2 fL (ref 78.0–100.0)
Platelets: 222 10*3/uL (ref 150–400)
RBC: 4.9 MIL/uL (ref 3.87–5.11)
RDW: 12.1 % (ref 11.5–15.5)
WBC: 9.1 K/uL (ref 4.0–10.5)

## 2011-01-07 LAB — WET PREP, GENITAL
Trich, Wet Prep: NONE SEEN
Yeast Wet Prep HPF POC: NONE SEEN

## 2011-01-07 LAB — PREGNANCY, URINE
Preg Test, Ur: NEGATIVE
Preg Test, Ur: NEGATIVE

## 2011-01-07 LAB — COMPREHENSIVE METABOLIC PANEL
Albumin: 4.7 g/dL (ref 3.5–5.2)
BUN: 10 mg/dL (ref 6–23)
Creatinine, Ser: 0.8 mg/dL (ref 0.4–1.2)
Total Protein: 7.6 g/dL (ref 6.0–8.3)

## 2011-01-07 LAB — URINE MICROSCOPIC-ADD ON: WBC, UA: NONE SEEN WBC/hpf (ref <3)

## 2011-01-07 LAB — GC/CHLAMYDIA PROBE AMP, GENITAL: Chlamydia, DNA Probe: NEGATIVE

## 2011-01-07 LAB — URINE CULTURE: Culture: NO GROWTH

## 2011-01-07 LAB — LIPASE, BLOOD: Lipase: 49 U/L (ref 23–300)

## 2011-04-05 DIAGNOSIS — N951 Menopausal and female climacteric states: Secondary | ICD-10-CM | POA: Insufficient documentation

## 2012-08-15 ENCOUNTER — Other Ambulatory Visit (HOSPITAL_COMMUNITY): Payer: Self-pay | Admitting: Obstetrics and Gynecology

## 2012-08-31 ENCOUNTER — Other Ambulatory Visit (HOSPITAL_COMMUNITY): Payer: Self-pay | Admitting: Internal Medicine

## 2012-08-31 DIAGNOSIS — Z1231 Encounter for screening mammogram for malignant neoplasm of breast: Secondary | ICD-10-CM

## 2012-09-10 ENCOUNTER — Ambulatory Visit: Payer: Self-pay | Admitting: Emergency Medicine

## 2012-09-10 ENCOUNTER — Encounter: Payer: Self-pay | Admitting: Emergency Medicine

## 2012-09-10 ENCOUNTER — Ambulatory Visit: Payer: Self-pay

## 2012-09-10 ENCOUNTER — Ambulatory Visit (HOSPITAL_COMMUNITY)
Admission: RE | Admit: 2012-09-10 | Discharge: 2012-09-10 | Disposition: A | Discharge status: Home / Self Care | Payer: Self-pay | Source: Ambulatory Visit | Attending: Internal Medicine | Admitting: Internal Medicine

## 2012-09-10 VITALS — BP 141/78 | HR 77 | Temp 97.9°F | Resp 18 | Ht 68.0 in | Wt 276.0 lb

## 2012-09-10 DIAGNOSIS — S63509A Unspecified sprain of unspecified wrist, initial encounter: Secondary | ICD-10-CM

## 2012-09-10 DIAGNOSIS — Z1231 Encounter for screening mammogram for malignant neoplasm of breast: Secondary | ICD-10-CM

## 2012-09-10 DIAGNOSIS — S20229A Contusion of unspecified back wall of thorax, initial encounter: Secondary | ICD-10-CM

## 2012-09-10 DIAGNOSIS — M549 Dorsalgia, unspecified: Secondary | ICD-10-CM

## 2012-09-10 DIAGNOSIS — S93409A Sprain of unspecified ligament of unspecified ankle, initial encounter: Secondary | ICD-10-CM

## 2012-09-10 MED ORDER — HYDROCODONE-ACETAMINOPHEN 5-325 MG PO TABS: 1.0000 | ORAL_TABLET | ORAL | Status: AC | PRN

## 2012-09-10 NOTE — Patient Instructions (Addendum)
Back Pain, Adult  Low back pain is very common. About 1 in 5 people have back pain. The cause of low back pain is rarely dangerous. The pain often gets better over time. About half of people with a sudden onset of back pain feel better in just 2 weeks. About 8 in 10 people feel better by 6 weeks.   CAUSES  Some common causes of back pain include:  · Strain of the muscles or ligaments supporting the spine.  · Wear and tear (degeneration) of the spinal discs.  · Arthritis.  · Direct injury to the back.  DIAGNOSIS  Most of the time, the direct cause of low back pain is not known. However, back pain can be treated effectively even when the exact cause of the pain is unknown. Answering your caregiver's questions about your overall health and symptoms is one of the most accurate ways to make sure the cause of your pain is not dangerous. If your caregiver needs more information, he or she may order lab work or imaging tests (X-rays or MRIs). However, even if imaging tests show changes in your back, this usually does not require surgery.  HOME CARE INSTRUCTIONS  For many people, back pain returns. Since low back pain is rarely dangerous, it is often a condition that people can learn to manage on their own.   · Remain active. It is stressful on the back to sit or stand in one place. Do not sit, drive, or stand in one place for more than 30 minutes at a time. Take short walks on level surfaces as soon as pain allows. Try to increase the length of time you walk each day.  · Do not stay in bed. Resting more than 1 or 2 days can delay your recovery.  · Do not avoid exercise or work. Your body is made to move. It is not dangerous to be active, even though your back may hurt. Your back will likely heal faster if you return to being active before your pain is gone.  · Pay attention to your body when you  bend and lift. Many people have less discomfort when lifting if they bend their knees, keep the load close to their bodies, and  avoid twisting. Often, the most comfortable positions are those that put less stress on your recovering back.  · Find a comfortable position to sleep. Use a firm mattress and lie on your side with your knees slightly bent. If you lie on your back, put a pillow under your knees.  · Only take over-the-counter or prescription medicines as directed by your caregiver. Over-the-counter medicines to reduce pain and inflammation are often the most helpful. Your caregiver may prescribe muscle relaxant drugs. These medicines help dull your pain so you can more quickly return to your normal activities and healthy exercise.  · Put ice on the injured area.  · Put ice in a plastic bag.  · Place a towel between your skin and the bag.  · Leave the ice on for 15-20 minutes, 3-4 times a day for the first 2 to 3 days. After that, ice and heat may be alternated to reduce pain and spasms.  · Ask your caregiver about trying back exercises and gentle massage. This may be of some benefit.  · Avoid feeling anxious or stressed. Stress increases muscle tension and can worsen back pain. It is important to recognize when you are anxious or stressed and learn ways to manage it. Exercise is a great option.  SEEK MEDICAL CARE IF:  · You have pain that is not relieved with rest or   medicine.  · You have pain that does not improve in 1 week.  · You have new symptoms.  · You are generally not feeling well.  SEEK IMMEDIATE MEDICAL CARE IF:   · You have pain that radiates from your back into your legs.  · You develop new bowel or bladder control problems.  · You have unusual weakness or numbness in your arms or legs.  · You develop nausea or vomiting.  · You develop abdominal pain.  · You feel faint.  Document Released: 03/21/2005 Document Revised: 09/20/2011 Document Reviewed: 08/09/2010  ExitCare® Patient Information ©2014 [employer].

## 2012-09-10 NOTE — Progress Notes (Signed)
[facility] [address] [place] [address] [phone]  Date:  09/10/2012   Name:  Stefanie Payne   DOB:  12/24/1965   MRN:  [id number]  PCP:  No primary provider on file.    Chief Complaint: Fall   History of Present Illness:  Stefanie Payne is a 47 y.o. very pleasant female patient who presents with the following:  Slipped on a wet steps and fell down the rest of the flight while taking out her dog.  She landed on her back and broke her fall with the right hand.  She has pain in her right wrist and ankle and in the low and thoracic spine.  Non radiating and no neuro symptoms.  Ambulates with some difficulty and worked today.  No improvement with over the counter medications or other home remedies. Denies other complaint or health concern today.   There are no active problems to display for this patient.   Past Medical History  Diagnosis Date  . Seasonal allergies   . Depression   . HTN (hypertension)   . Anxiety     Past Surgical History  Procedure Laterality Date  . Cesarean section    . Partial hysterectomy    . Tubal ligation      History  Substance Use Topics  . Smoking status: Current Every Day Smoker  . Smokeless tobacco: Not on file  . Alcohol Use: No    Family History  Problem Relation Age of Onset  . Cancer Mother   . Thyroid disease Mother   . COPD Father   . Hypertension Father   . Hypertension Brother   . Asthma Daughter     Allergies  Allergen Reactions  . Other     novacaine    Medication list has been reviewed and updated.  No current outpatient prescriptions on file prior to visit.   No current facility-administered medications on file prior to visit.    Review of Systems:  As per HPI, otherwise negative.    Physical Examination: Filed Vitals:   09/10/12 1644  BP: 141/78  Pulse: 77  Temp: 97.9 F (36.6 C)  Resp: 18   Filed Vitals:   09/10/12 1644  Height: 5\' 8"  (1.727 m)  Weight: 276 lb  (125.193 kg)   Body mass index is 41.98 kg/(m^2). Ideal Body Weight: Weight in (lb) to have BMI = 25: 164.1   GEN: WDWN, NAD, Non-toxic, Alert & Oriented x 3 HEENT: Atraumatic, Normocephalic.  Ears and Nose: No external deformity. EXTR: No clubbing/cyanosis/edema.  Tender dorsal right wrist and lateral right ankle.   NEURO: antalgic gait.  PSYCH: Normally interactive. Conversant. Not depressed or anxious appearing.  Calm demeanor.  BACK:  Tender paravertebral muscles in dorsal and lumbar regions.  Neuro grossly intact.     Assessment and Plan: Sprain ankle,  Contusion back Contusion wrist Ice and elevate vicodin Follow up as needed and in one week.  Signed,  Phillips Odor, MD  [facility] reading (PRIMARY) by  Dr. Dareen Piano. Negative wrist [facility] reading (PRIMARY) by  Dr. Dareen Piano.  Negative ankle.  [facility] reading (PRIMARY) by  Dr. Dareen Piano.  Negative thoracic spine.  [facility] reading (PRIMARY) by  Dr. Dareen Piano.  Negative lumbar spine.

## 2015-08-18 ENCOUNTER — Other Ambulatory Visit: Payer: Self-pay | Admitting: Internal Medicine

## 2015-08-18 DIAGNOSIS — R7989 Other specified abnormal findings of blood chemistry: Secondary | ICD-10-CM

## 2015-08-18 DIAGNOSIS — R945 Abnormal results of liver function studies: Principal | ICD-10-CM

## 2015-08-24 ENCOUNTER — Other Ambulatory Visit: Payer: Self-pay

## 2015-08-28 ENCOUNTER — Ambulatory Visit
Admission: RE | Admit: 2015-08-28 | Discharge: 2015-08-28 | Disposition: A | Discharge status: Home / Self Care | Payer: No Typology Code available for payment source | Source: Ambulatory Visit | Attending: Internal Medicine | Admitting: Internal Medicine

## 2015-08-28 DIAGNOSIS — R7989 Other specified abnormal findings of blood chemistry: Secondary | ICD-10-CM

## 2015-08-28 DIAGNOSIS — R945 Abnormal results of liver function studies: Principal | ICD-10-CM

## 2016-05-31 ENCOUNTER — Encounter: Payer: Self-pay | Admitting: Obstetrics and Gynecology

## 2018-04-06 ENCOUNTER — Other Ambulatory Visit: Payer: Self-pay | Admitting: Internal Medicine

## 2018-04-06 DIAGNOSIS — Z1231 Encounter for screening mammogram for malignant neoplasm of breast: Secondary | ICD-10-CM

## 2018-04-09 ENCOUNTER — Ambulatory Visit
Admission: RE | Admit: 2018-04-09 | Discharge: 2018-04-09 | Disposition: A | Discharge status: Home / Self Care | Payer: No Typology Code available for payment source | Source: Ambulatory Visit | Attending: Internal Medicine | Admitting: Internal Medicine

## 2018-04-09 DIAGNOSIS — Z1231 Encounter for screening mammogram for malignant neoplasm of breast: Secondary | ICD-10-CM

## 2018-05-09 ENCOUNTER — Ambulatory Visit (INDEPENDENT_AMBULATORY_CARE_PROVIDER_SITE_OTHER): Payer: Self-pay

## 2018-05-09 ENCOUNTER — Other Ambulatory Visit: Payer: Self-pay | Admitting: Podiatrist

## 2018-05-09 ENCOUNTER — Encounter: Payer: Self-pay | Admitting: Podiatrist

## 2018-05-09 ENCOUNTER — Ambulatory Visit (INDEPENDENT_AMBULATORY_CARE_PROVIDER_SITE_OTHER): Payer: Self-pay | Admitting: Podiatrist

## 2018-05-09 VITALS — BP 121/65 | HR 60

## 2018-05-09 DIAGNOSIS — M779 Enthesopathy, unspecified: Secondary | ICD-10-CM

## 2018-05-09 DIAGNOSIS — M79672 Pain in left foot: Secondary | ICD-10-CM

## 2018-05-09 DIAGNOSIS — M7662 Achilles tendinitis, left leg: Secondary | ICD-10-CM

## 2018-05-09 DIAGNOSIS — N951 Menopausal and female climacteric states: Secondary | ICD-10-CM | POA: Insufficient documentation

## 2018-05-09 DIAGNOSIS — M7732 Calcaneal spur, left foot: Secondary | ICD-10-CM

## 2018-05-09 NOTE — Patient Instructions (Signed)
Achilles Tendinitis  Achilles tendinitis is inflammation of the tough, cord-like band that attaches the lower leg muscles to the heel bone (Achilles tendon). This is usually caused by overusing the tendon and the ankle joint. Achilles tendinitis usually gets better over time with treatment and caring for yourself at home. It can take weeks or months to heal completely. What are the causes? This condition may be caused by:  A sudden increase in exercise or activity, such as running.  Doing the same exercises or activities (such as jumping) over and over.  Not warming up calf muscles before exercising.  Exercising in shoes that are worn out or not made for exercise.  Having arthritis or a bone growth (spur) on the back of the heel bone. This can rub against the tendon and hurt it.  Age-related wear and tear. Tendons become less flexible with age and more likely to be injured. What are the signs or symptoms? Common symptoms of this condition include:  Pain in the Achilles tendon or in the back of the leg, just above the heel. The pain usually gets worse with exercise.  Stiffness or soreness in the back of the leg, especially in the morning.  Swelling of the skin over the Achilles tendon.  Thickening of the tendon.  Bone spurs at the bottom of the Achilles tendon, near the heel.  Trouble standing on tiptoe. How is this diagnosed? This condition is diagnosed based on your symptoms and a physical exam. You may have tests, including:  X-rays.  MRI. How is this treated? The goal of treatment is to relieve symptoms and help your injury heal. Treatment may include:  Decreasing or stopping activities that caused the tendinitis. This may mean switching to low-impact exercises like biking or swimming.  Icing the injured area.  Doing physical therapy, including strengthening and stretching exercises.  NSAIDs to help relieve pain and swelling.  Using supportive shoes, wraps, heel  lifts, or a walking boot (air cast).  Surgery. This may be done if your symptoms do not improve after 6 months.  Using high-energy shock wave impulses to stimulate the healing process (extracorporeal shock wave therapy). This is rare.  Injection of medicines to help relieve inflammation (corticosteroids). This is rare. Follow these instructions at home: If you have an air cast:  Wear the cast as told by your health care provider. Remove it only as told by your health care provider.  Loosen the cast if your toes tingle, become numb, or turn cold and blue. Activity  Gradually return to your normal activities once your health care provider approves. Do not do activities that cause pain. ? Consider doing low-impact exercises, like cycling or swimming.  If you have an air cast, ask your health care provider when it is safe for you to drive.  If physical therapy was prescribed, do exercises as told by your health care provider or physical therapist. Managing pain, stiffness, and swelling   Raise (elevate) your foot above the level of your heart while you are sitting or lying down.  Move your toes often to avoid stiffness and to lessen swelling.  If directed, put ice on the injured area: ? Put ice in a plastic bag. ? Place a towel between your skin and the bag. ? Leave the ice on for 20 minutes, 2-3 times a day General instructions  If directed, wrap your foot with an elastic bandage or other wrap. This can help keep your tendon from moving too much while it   heals. Your health care provider will show you how to wrap your foot correctly.  Wear supportive shoes or heel lifts only as told by your health care provider.  Take over-the-counter and prescription medicines only as told by your health care provider.  Keep all follow-up visits as told by your health care provider. This is important. Contact a health care provider if:  You have symptoms that gets worse.  You have pain that  does not get better with medicine.  You develop new, unexplained symptoms.  You develop warmth and swelling in your foot.  You have a fever. Get help right away if:  You have a sudden popping sound or sensation in your Achilles tendon followed by severe pain.  You cannot move your toes or foot.  You cannot put any weight on your foot. Summary  Achilles tendinitis is inflammation of the tough, cord-like band that attaches the lower leg muscles to the heel bone (Achilles tendon).  This condition is usually caused by overusing the tendon and the ankle joint. It can also be caused by arthritis or normal aging.  The most common symptoms of this condition include pain, swelling, or stiffness in the Achilles tendon or in the back of the leg.  This condition is usually treated with rest, NSAIDs, and physical therapy. This information is not intended to replace advice given to you by your health care provider. Make sure you discuss any questions you have with your health care provider. Document Released: 12/29/2004 Document Revised: 02/08/2016 Document Reviewed: 02/08/2016 Elsevier Interactive Patient Education  2019 [employer].   

## 2018-05-09 NOTE — Progress Notes (Signed)
DG  °

## 2018-05-10 NOTE — Progress Notes (Signed)
Chief Complaint  Patient presents with  . Foot Injury    Left posterior heel pain "knot" that has burning sensation, duration 50months. Pt states felt sharp pain while dancing 76mo ago.     HPI: Patient is 53 y.o. female who presents today for heel pain on the posterior aspect of the left heel.  She states there is a lump present that she noted 2 months ago after dancing with her sons at home.  She was barefoot while dancing and denies any trauma or injury at the time.   Allergies  Allergen Reactions  . Codeine Hives  . Metronidazole Hives  . Other     novacaine  . Procaine     Review of systems is reviewed and negative.   Physical Exam  Patient is awake, alert, and oriented x 3.  In no acute distress.    Vascular status is intact with palpable pedal pulses at 2/4 DP and PT bilateral and capillary refill time within normal limits.  Neurological sensation is also intact bilaterally via Semmes Weinstein monofilament at 5/5 sites. Light touch, vibratory sensation, Achilles tendon reflex is intact.  Some nerve type pain is also present in the posterior lateral aspect of the Achilles tendon upon palpation. Dermatological exam reveals skin color, turger and texture as normal. No open lesions present.   Musculoskeletal exam: Musculature intact with dorsiflexion, plantarflexion, inversion, eversion.  Direct pain on palpation of the posterior aspect of the left heel is noted at the insertion of the Achilles tendon on the calcaneus.  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  I did discuss that injections near the Achilles tendon increase the risk of rupture she understand and wishes to proceed.  The area was then wiped with alcohol and  a injection consisting of 2 mg dexamethasone as well as lidocaine with epinephrine was infiltrated into the soft tissue being careful to stay away from the Achilles tendon itself.  A low air fracture walker was then dispensed and she was given instructions for wearing the boot.  She will be seen back in 3 weeks for follow-up.

## 2018-06-04 ENCOUNTER — Ambulatory Visit: Payer: Self-pay | Admitting: Podiatrist

## 2019-04-25 ENCOUNTER — Other Ambulatory Visit: Payer: Self-pay | Admitting: Family Medicine

## 2019-04-25 DIAGNOSIS — Z1231 Encounter for screening mammogram for malignant neoplasm of breast: Secondary | ICD-10-CM

## 2019-05-30 ENCOUNTER — Ambulatory Visit: Payer: No Typology Code available for payment source

## 2019-05-31 ENCOUNTER — Ambulatory Visit: Payer: No Typology Code available for payment source

## 2019-07-04 ENCOUNTER — Other Ambulatory Visit: Payer: Self-pay

## 2019-07-04 ENCOUNTER — Ambulatory Visit
Admission: RE | Admit: 2019-07-04 | Discharge: 2019-07-04 | Disposition: A | Discharge status: Home / Self Care | Payer: No Typology Code available for payment source | Source: Ambulatory Visit | Attending: Family Medicine | Admitting: Family Medicine

## 2019-07-04 DIAGNOSIS — Z1231 Encounter for screening mammogram for malignant neoplasm of breast: Secondary | ICD-10-CM

## 2020-08-05 ENCOUNTER — Other Ambulatory Visit: Payer: Self-pay | Admitting: Family Medicine

## 2020-08-05 DIAGNOSIS — Z1231 Encounter for screening mammogram for malignant neoplasm of breast: Secondary | ICD-10-CM

## 2020-09-25 ENCOUNTER — Ambulatory Visit: Payer: No Typology Code available for payment source

## 2020-10-26 ENCOUNTER — Other Ambulatory Visit: Payer: Self-pay

## 2020-10-26 ENCOUNTER — Ambulatory Visit
Admission: RE | Admit: 2020-10-26 | Discharge: 2020-10-26 | Disposition: A | Discharge status: Home / Self Care | Payer: No Typology Code available for payment source | Source: Ambulatory Visit | Attending: Family Medicine | Admitting: Family Medicine

## 2020-10-26 DIAGNOSIS — Z1231 Encounter for screening mammogram for malignant neoplasm of breast: Secondary | ICD-10-CM

## 2020-11-06 ENCOUNTER — Ambulatory Visit (HOSPITAL_COMMUNITY)
Admission: EM | Admit: 2020-11-06 | Discharge: 2020-11-06 | Disposition: A | Discharge status: Home / Self Care | Payer: No Typology Code available for payment source | Attending: Emergency Medicine | Admitting: Emergency Medicine

## 2020-11-06 ENCOUNTER — Encounter (HOSPITAL_COMMUNITY): Payer: Self-pay

## 2020-11-06 ENCOUNTER — Other Ambulatory Visit: Payer: Self-pay

## 2020-11-06 DIAGNOSIS — B373 Candidiasis of vulva and vagina: Secondary | ICD-10-CM

## 2020-11-06 DIAGNOSIS — B3731 Acute candidiasis of vulva and vagina: Secondary | ICD-10-CM

## 2020-11-06 DIAGNOSIS — R81 Glycosuria: Secondary | ICD-10-CM | POA: Insufficient documentation

## 2020-11-06 LAB — POCT URINALYSIS DIPSTICK, ED / UC
Bilirubin Urine: NEGATIVE
Glucose, UA: >=1000 mg/dL — AB
Ketones, ur: NEGATIVE mg/dL
Leukocytes,Ua: NEGATIVE
Nitrite: NEGATIVE
Protein, ur: NEGATIVE mg/dL
Specific Gravity, Urine: 1.015 (ref 1.005–1.030)
Urobilinogen, UA: 0.2 mg/dL (ref 0.0–1.0)
pH: 5.5 (ref 5.0–8.0)

## 2020-11-06 LAB — CBG MONITORING, ED: Glucose-Capillary: 308 mg/dL — ABNORMAL HIGH (ref 70–99)

## 2020-11-06 MED ORDER — FLUCONAZOLE 150 MG PO TABS: ORAL_TABLET | ORAL | 0 refills | Status: AC

## 2020-11-06 NOTE — ED Provider Notes (Signed)
[facility]    CSN: [id number] Arrival date & time: 11/06/20  1029      History   Chief Complaint Chief Complaint  Patient presents with   Dysuria    HPI Stefanie Payne is a 55 y.o. female.   Patient here for evaluation of dysuria, vaginal irritation, and vaginal itching that has been ongoing for the past week.  Does report worsening incontinence over the past few weeks to months.  Denies any history of diabetes.  Reports using OTC yeast treatment with minimal results.  Denies any trauma, injury, or other precipitating event.  Denies any specific alleviating or aggravating factors.  Denies any fevers, chest pain, shortness of breath, N/V/D, numbness, tingling, weakness, abdominal pain, or headaches.    The history is provided by the patient.  Dysuria Associated symptoms: no flank pain    Past Medical History:  Diagnosis Date   Anxiety    Depression    HTN (hypertension)    Seasonal allergies     Patient Active Problem List   Diagnosis Date Noted   Menopausal syndrome 05/09/2018   Menopausal symptom 04/05/2011    Past Surgical History:  Procedure Laterality Date   CESAREAN SECTION     PARTIAL HYSTERECTOMY     TUBAL LIGATION      OB History   No obstetric history on file.      Home Medications    Prior to Admission medications   Medication Sig Start Date End Date Taking? Authorizing Provider  fluconazole (DIFLUCAN) 150 MG tablet Take one pill today.  Take the second pill in 3 days if you are are still having symptoms. 11/06/20  Yes Ivette Loyal, NP  ALPRAZolam Prudy Feeler) 0.5 MG tablet Take 0.5 mg by mouth at bedtime as needed for sleep.    [provider]  ciprofloxacin (CIPRO) 250 MG tablet TAKE 1 TABLET BY MOUTH TWICE A DAY FOR 5 DAYS 04/05/18   [provider]  citalopram (CELEXA) 20 MG tablet Take 20 mg by mouth daily.    [provider]  hydrochlorothiazide (HYDRODIURIL) 25 MG tablet Take 25 mg by mouth daily.     [provider]  HYDROcodone-acetaminophen (NORCO) 5-325 MG per tablet Take 1-2 tablets by mouth every 4 (four) hours as needed for pain. 09/10/12   Carmelina Dane, MD  losartan (COZAAR) 50 MG tablet losartan 50 mg tablet    [provider]  metFORMIN (GLUCOPHAGE) 1000 MG tablet metformin 1,000 mg tablet    [provider]  metoprolol succinate (TOPROL-XL) 100 MG 24 hr tablet Take 100 mg by mouth daily. Take with or immediately following a meal.    [provider]  metoprolol tartrate (LOPRESSOR) 100 MG tablet metoprolol tartrate 100 mg tablet    [provider]  polyethylene glycol-electrolytes (NULYTELY/GOLYTELY) 420 g solution See admin instructions. 04/10/18   [provider]    Family History Family History  Problem Relation Age of Onset   Cancer Mother    Thyroid disease Mother    COPD Father    Hypertension Father    Hypertension Brother    Asthma Daughter     Social History Social History   Tobacco Use   Smoking status: Every Day   Smokeless tobacco: Never  Substance Use Topics   Alcohol use: No   Drug use: No     Allergies   Codeine, Metronidazole, Other, and Procaine   Review of Systems Review of Systems  Genitourinary:  Positive for  dysuria, frequency and vaginal pain. Negative for decreased urine volume, difficulty urinating, flank pain and vaginal bleeding.  All other systems reviewed and are negative.   Physical Exam Triage Vital Signs ED Triage Vitals  Enc Vitals Group     BP 11/06/20 1130 (!) 112/57     Pulse Rate 11/06/20 1130 60     Resp 11/06/20 1129 19     Temp 11/06/20 1129 98.4 F (36.9 C)     Temp Source 11/06/20 1129 Oral     SpO2 11/06/20 1129 98 %     Weight --      Height --      Head Circumference --      Peak Flow --      Pain Score 11/06/20 1127 7     Pain Loc --      Pain Edu? --      Excl. in GC? --    No data found.  Updated Vital Signs BP (!) 112/57 (BP Location:  Right Arm)   Pulse 60   Temp 98.4 F (36.9 C) (Oral)   Resp 19   SpO2 98%   Visual Acuity Right Eye Distance:   Left Eye Distance:   Bilateral Distance:    Right Eye Near:   Left Eye Near:    Bilateral Near:     Physical Exam Vitals and nursing note reviewed.  Constitutional:      General: She is not in acute distress.    Appearance: Normal appearance. She is not ill-appearing, toxic-appearing or diaphoretic.  HENT:     Head: Normocephalic and atraumatic.  Eyes:     Conjunctiva/sclera: Conjunctivae normal.  Cardiovascular:     Rate and Rhythm: Normal rate.     Pulses: Normal pulses.  Pulmonary:     Effort: Pulmonary effort is normal.  Abdominal:     General: Abdomen is flat.  Genitourinary:    Comments: declines Musculoskeletal:        General: Normal range of motion.     Cervical back: Normal range of motion.  Skin:    General: Skin is warm and dry.  Neurological:     General: No focal deficit present.     Mental Status: She is alert and oriented to person, place, and time.  Psychiatric:        Mood and Affect: Mood normal.     UC Treatments / Results  Labs (all labs ordered are listed, but only abnormal results are displayed) Labs Reviewed  POCT URINALYSIS DIPSTICK, ED / UC - Abnormal; Notable for the following components:      Result Value   Glucose, UA >=1000 (*)    Hgb urine dipstick SMALL (*)    All other components within normal limits  CBG MONITORING, ED - Abnormal; Notable for the following components:   Glucose-Capillary 308 (*)    All other components within normal limits  CERVICOVAGINAL ANCILLARY ONLY    EKG   Radiology No results found.  Procedures Procedures (including critical care time)  Medications Ordered in UC Medications - No data to display  Initial Impression / Assessment and Plan / UC Course  I have reviewed the triage vital signs and the nursing notes.  Pertinent labs & imaging results that were available during my  care of the patient were reviewed by me and considered in my medical decision making (see chart for details).    Assessment negative for red flags or concerns.  Urinalysis with glucose >1000 and small hgb.  CBG 308.  Patient was previously on metformin but reports that she was told she could stop taking it "awhile" ago.  Discussed that patient may need to start metformin again for blood sugar control and possible diabetes.  Patient declined metformin prescription today.  Given diet recommendations and instructed to follow up with primary care as soon as possible.  Patient likely has yeast infection causing vaginal irritation and dysuria.  Self swab obtained.  Will treat for likely yeast infection with diflucan once today and repeat in 3 days if symptoms have not resolved.   Final Clinical Impressions(s) / UC Diagnoses   Final diagnoses:  Yeast vaginitis  Glucosuria     Discharge Instructions      Follow up with your primary care provider as soon as possible for re-evaluation of elevated blood sugar and sugar in your urine.    You can take the diflucan one pill today and another in 3 days if you are still having symptoms.    For diabetes or elevated blood sugar, please make sure you are limiting and avoiding starchy, carbohydrate foods like pasta, breads, sweet breads, pastry, rice, potatoes, desserts. These foods can elevate your blood sugar. Also, limit and avoid drinks that contain a lot of sugar such as sodas, sweet teas, fruit juices.  Drinking plain water will be much more helpful, try 64 ounces of water daily.  It is okay to flavor your water naturally by cutting cucumber, lemon, mint or lime, placing it in a picture with water and drinking it over a period of 24-48 hours as long as it remains refrigerated.  For elevated blood pressure, make sure you are monitoring salt in your diet.  Do not eat restaurant foods and limit processed foods at home. I highly recommend you prepare and cook  your own foods at home.  Processed foods include things like frozen meals, pre-seasoned meats and dinners, deli meats, canned foods as these foods contain a high amount of sodium/salt.  Make sure you are paying attention to sodium labels on foods you buy at the grocery store. Buy your spices separately such as garlic powder, onion powder, cumin, cayenne, parsley flakes so that you can avoid seasonings that contain salt. However, salt-free seasonings are available and can be used, an example is Mrs. Dash and includes a lot of different mixtures that do not contain salt.  Lastly, when cooking using oils that are healthier for you is important. This includes olive oil, avocado oil, canola oil. We have discussed a lot of foods to avoid but below is a list of foods that can be very healthy to use in your diet whether it is for diabetes, cholesterol, high blood pressure, or in general healthy eating.  Salads - kale, spinach, cabbage, spring mix, arugula Fruits - avocadoes, berries (blueberries, raspberries, blackberries), apples, oranges, pomegranate, grapefruit, kiwi Vegetables - asparagus, cauliflower, broccoli, green beans, brussel sprouts, bell peppers, beets; stay away from or limit starchy vegetables like potatoes, carrots, peas Other general foods - kidney beans, egg whites, almonds, walnuts, sunflower seeds, pumpkin seeds, fat free yogurt, almond milk, flax seeds, quinoa, oats  Meat - It is better to eat lean meats and limit your red meat including pork to once a week.  Wild caught fish, chicken breast are good options as they tend to be leaner sources of good protein. Still be mindful of the sodium labels for the meats you buy.  DO NOT EAT ANY FOODS ON THIS LIST THAT YOU ARE ALLERGIC TO.  For more specific needs, I highly recommend consulting a dietician or nutritionist but this can definitely be a good starting point.     ED Prescriptions     Medication Sig Dispense Auth. Provider   fluconazole  (DIFLUCAN) 150 MG tablet Take one pill today.  Take the second pill in 3 days if you are are still having symptoms. 2 tablet Ivette Loyal, NP      PDMP not reviewed this encounter.   Ivette Loyal, NP 11/06/20 [phone]

## 2020-11-06 NOTE — ED Triage Notes (Signed)
Pt presents with c/o hot urine and dysuria X 5 days.   States she has been itching and has had vaginal pain.

## 2020-11-06 NOTE — Discharge Instructions (Addendum)
Follow up with your primary care provider as soon as possible for re-evaluation of elevated blood sugar and sugar in your urine.    You can take the diflucan one pill today and another in 3 days if you are still having symptoms.    For diabetes or elevated blood sugar, please make sure you are limiting and avoiding starchy, carbohydrate foods like pasta, breads, sweet breads, pastry, rice, potatoes, desserts. These foods can elevate your blood sugar. Also, limit and avoid drinks that contain a lot of sugar such as sodas, sweet teas, fruit juices.  Drinking plain water will be much more helpful, try 64 ounces of water daily.  It is okay to flavor your water naturally by cutting cucumber, lemon, mint or lime, placing it in a picture with water and drinking it over a period of 24-48 hours as long as it remains refrigerated.  For elevated blood pressure, make sure you are monitoring salt in your diet.  Do not eat restaurant foods and limit processed foods at home. I highly recommend you prepare and cook your own foods at home.  Processed foods include things like frozen meals, pre-seasoned meats and dinners, deli meats, canned foods as these foods contain a high amount of sodium/salt.  Make sure you are paying attention to sodium labels on foods you buy at the grocery store. Buy your spices separately such as garlic powder, onion powder, cumin, cayenne, parsley flakes so that you can avoid seasonings that contain salt. However, salt-free seasonings are available and can be used, an example is Mrs. Dash and includes a lot of different mixtures that do not contain salt.  Lastly, when cooking using oils that are healthier for you is important. This includes olive oil, avocado oil, canola oil. We have discussed a lot of foods to avoid but below is a list of foods that can be very healthy to use in your diet whether it is for diabetes, cholesterol, high blood pressure, or in general healthy eating.  Salads - kale,  spinach, cabbage, spring mix, arugula Fruits - avocadoes, berries (blueberries, raspberries, blackberries), apples, oranges, pomegranate, grapefruit, kiwi Vegetables - asparagus, cauliflower, broccoli, green beans, brussel sprouts, bell peppers, beets; stay away from or limit starchy vegetables like potatoes, carrots, peas Other general foods - kidney beans, egg whites, almonds, walnuts, sunflower seeds, pumpkin seeds, fat free yogurt, almond milk, flax seeds, quinoa, oats  Meat - It is better to eat lean meats and limit your red meat including pork to once a week.  Wild caught fish, chicken breast are good options as they tend to be leaner sources of good protein. Still be mindful of the sodium labels for the meats you buy.  DO NOT EAT ANY FOODS ON THIS LIST THAT YOU ARE ALLERGIC TO. For more specific needs, I highly recommend consulting a dietician or nutritionist but this can definitely be a good starting point.

## 2020-11-08 LAB — CERVICOVAGINAL ANCILLARY ONLY
Bacterial Vaginitis (gardnerella): NEGATIVE
Candida Glabrata: POSITIVE — AB
Candida Vaginitis: POSITIVE — AB
Chlamydia: NEGATIVE
Comment: NEGATIVE
Comment: NEGATIVE
Comment: NEGATIVE
Comment: NEGATIVE
Comment: NEGATIVE
Comment: NORMAL
Neisseria Gonorrhea: NEGATIVE
Trichomonas: NEGATIVE

## 2021-10-15 ENCOUNTER — Other Ambulatory Visit: Payer: Self-pay | Admitting: Family Medicine

## 2021-10-15 DIAGNOSIS — Z1231 Encounter for screening mammogram for malignant neoplasm of breast: Secondary | ICD-10-CM

## 2021-10-19 ENCOUNTER — Ambulatory Visit
Admission: RE | Admit: 2021-10-19 | Discharge: 2021-10-19 | Disposition: A | Discharge status: Home / Self Care | Payer: No Typology Code available for payment source | Source: Ambulatory Visit

## 2021-10-19 DIAGNOSIS — Z1231 Encounter for screening mammogram for malignant neoplasm of breast: Secondary | ICD-10-CM

## 2022-10-14 ENCOUNTER — Other Ambulatory Visit: Payer: Self-pay | Admitting: Family Medicine

## 2022-10-14 DIAGNOSIS — Z1231 Encounter for screening mammogram for malignant neoplasm of breast: Secondary | ICD-10-CM

## 2022-10-28 ENCOUNTER — Ambulatory Visit
Admission: RE | Admit: 2022-10-28 | Discharge: 2022-10-28 | Disposition: A | Discharge status: Home / Self Care | Payer: No Typology Code available for payment source | Source: Ambulatory Visit | Attending: Family Medicine | Admitting: Family Medicine

## 2022-10-28 DIAGNOSIS — Z1231 Encounter for screening mammogram for malignant neoplasm of breast: Secondary | ICD-10-CM
